# Patient Record
Sex: Female | Born: 1945
Health system: Southern US, Community
[De-identification: ages and names within clinical notes are randomized; demographics above are authoritative.]

## PROBLEM LIST (undated history)

## (undated) DIAGNOSIS — I10 Essential (primary) hypertension: Secondary | ICD-10-CM

## (undated) DIAGNOSIS — K754 Autoimmune hepatitis: Secondary | ICD-10-CM

## (undated) DIAGNOSIS — E7800 Pure hypercholesterolemia, unspecified: Secondary | ICD-10-CM

## (undated) HISTORY — DX: Autoimmune hepatitis: K75.4

## (undated) HISTORY — PX: LAPAROSCOPIC HYSTERECTOMY: SHX1926

---

## 2000-11-26 ENCOUNTER — Encounter: Payer: Self-pay | Admitting: General Surgery

## 2000-11-26 ENCOUNTER — Ambulatory Visit (HOSPITAL_COMMUNITY): Admission: RE | Admit: 2000-11-26 | Discharge: 2000-11-26 | Payer: Self-pay | Admitting: General Surgery

## 2000-12-10 ENCOUNTER — Other Ambulatory Visit: Admission: RE | Admit: 2000-12-10 | Discharge: 2000-12-10 | Payer: Self-pay | Admitting: Family Medicine

## 2001-04-03 ENCOUNTER — Emergency Department (HOSPITAL_COMMUNITY): Admission: EM | Admit: 2001-04-03 | Discharge: 2001-04-03 | Payer: Self-pay | Admitting: *Deleted

## 2002-06-01 ENCOUNTER — Ambulatory Visit (HOSPITAL_COMMUNITY): Admission: RE | Admit: 2002-06-01 | Discharge: 2002-06-01 | Payer: Self-pay | Admitting: General Surgery

## 2002-06-01 ENCOUNTER — Encounter: Payer: Self-pay | Admitting: General Surgery

## 2002-06-27 ENCOUNTER — Encounter: Payer: Self-pay | Admitting: General Surgery

## 2002-06-27 ENCOUNTER — Ambulatory Visit (HOSPITAL_COMMUNITY): Admission: RE | Admit: 2002-06-27 | Discharge: 2002-06-27 | Payer: Self-pay | Admitting: General Surgery

## 2002-06-28 ENCOUNTER — Encounter: Payer: Self-pay | Admitting: Gastroenterology

## 2002-06-28 ENCOUNTER — Ambulatory Visit (HOSPITAL_COMMUNITY): Admission: RE | Admit: 2002-06-28 | Discharge: 2002-06-28 | Payer: Self-pay | Admitting: General Surgery

## 2003-06-21 ENCOUNTER — Inpatient Hospital Stay (HOSPITAL_COMMUNITY): Admission: AD | Admit: 2003-06-21 | Discharge: 2003-06-25 | Payer: Self-pay | Admitting: Family Medicine

## 2003-09-21 ENCOUNTER — Ambulatory Visit (HOSPITAL_COMMUNITY): Admission: RE | Admit: 2003-09-21 | Discharge: 2003-09-21 | Payer: Self-pay | Admitting: Family Medicine

## 2004-02-22 ENCOUNTER — Ambulatory Visit (HOSPITAL_COMMUNITY): Admission: RE | Admit: 2004-02-22 | Discharge: 2004-02-22 | Payer: Self-pay | Admitting: Family Medicine

## 2005-01-21 ENCOUNTER — Ambulatory Visit (HOSPITAL_COMMUNITY): Admission: RE | Admit: 2005-01-21 | Discharge: 2005-01-21 | Payer: Self-pay | Admitting: Family Medicine

## 2005-08-28 ENCOUNTER — Ambulatory Visit (HOSPITAL_COMMUNITY): Admission: RE | Admit: 2005-08-28 | Discharge: 2005-08-28 | Payer: Self-pay | Admitting: Family Medicine

## 2006-02-22 ENCOUNTER — Ambulatory Visit (HOSPITAL_COMMUNITY): Admission: RE | Admit: 2006-02-22 | Discharge: 2006-02-22 | Payer: Self-pay | Admitting: Family Medicine

## 2007-01-29 ENCOUNTER — Observation Stay (HOSPITAL_COMMUNITY): Admission: EM | Admit: 2007-01-29 | Discharge: 2007-01-30 | Payer: Self-pay | Admitting: Emergency Medicine

## 2007-09-02 ENCOUNTER — Ambulatory Visit (HOSPITAL_COMMUNITY): Admission: RE | Admit: 2007-09-02 | Discharge: 2007-09-02 | Payer: Self-pay | Admitting: Family Medicine

## 2008-10-28 ENCOUNTER — Observation Stay (HOSPITAL_COMMUNITY): Admission: EM | Admit: 2008-10-28 | Discharge: 2008-10-29 | Payer: Self-pay | Admitting: Emergency Medicine

## 2009-01-06 ENCOUNTER — Emergency Department (HOSPITAL_COMMUNITY): Admission: EM | Admit: 2009-01-06 | Discharge: 2009-01-06 | Payer: Self-pay | Admitting: Family Medicine

## 2009-06-11 ENCOUNTER — Telehealth (INDEPENDENT_AMBULATORY_CARE_PROVIDER_SITE_OTHER): Payer: Self-pay

## 2009-07-18 ENCOUNTER — Ambulatory Visit: Payer: Self-pay | Admitting: Gastroenterology

## 2009-10-13 ENCOUNTER — Emergency Department (HOSPITAL_COMMUNITY)
Admission: EM | Admit: 2009-10-13 | Discharge: 2009-10-13 | Payer: Self-pay | Source: Home / Self Care | Admitting: Emergency Medicine

## 2009-10-23 ENCOUNTER — Encounter (HOSPITAL_COMMUNITY): Admission: RE | Admit: 2009-10-23 | Discharge: 2009-11-22 | Payer: Self-pay | Admitting: Family Medicine

## 2009-11-27 ENCOUNTER — Encounter (HOSPITAL_COMMUNITY)
Admission: RE | Admit: 2009-11-27 | Discharge: 2009-12-27 | Payer: Self-pay | Source: Home / Self Care | Admitting: Family Medicine

## 2009-12-30 ENCOUNTER — Encounter (HOSPITAL_COMMUNITY)
Admission: RE | Admit: 2009-12-30 | Discharge: 2010-01-29 | Payer: Self-pay | Source: Home / Self Care | Admitting: Family Medicine

## 2010-03-16 ENCOUNTER — Encounter: Payer: Self-pay | Admitting: Family Medicine

## 2010-03-25 NOTE — Letter (Signed)
Summary: colonoscopy report  colonoscopy report   Imported By: Curtis Sites 08/08/2009 11:06:46  _____________________________________________________________________  External Attachment:    Type:   Image     Comment:   External Document

## 2010-03-25 NOTE — Progress Notes (Signed)
Summary: pt needs ov before TCS  Phone Note Other Incoming   Caller: Radiance Deady Summary of Call: Pt was triaged for a TCS. However, Dr. Darrick Penna said she does need an OV prior to the procedure. She was scheduled for 07/23/2009 at 7:30. ( Im leaving there at this time, til i speak with pt. She may be able to use that appt. if she chooses for her scheduling.) Initial call taken by: Cloria Spring LPN,  June 11, 2009 11:57 AM     Appended Document: pt needs ov before TCS OV schedueld for 07/18/09 @ 9 w/ SLF- cdg

## 2010-03-25 NOTE — Assessment & Plan Note (Signed)
Summary: COLON CANCER SCREENING   Visit Type:  Initial Consult Referring Provider:  Mirna Mires Primary Care Provider:  Mirna Mires, M.D.  Chief Complaint:  Consult for TCS.  History of Present Illness: No abd pain. BMs: 2-3x/week. No problems with swallowing, nausea or vomiting. No change in bowel habits or weight loss. No blood in stool or melena. Appetite: good. No diarrhea.  Preventive Screening-Counseling & Management  Alcohol-Tobacco     Smoking Status: never  Current Medications (verified): 1)  Aspir-Low 81 Mg Tbec (Aspirin) .... Take 1 Tablet By Mouth Once A Day 2)  Biotin 1000 Mg .... One Tablet Daily 3)  Diovan Hct 160-25 Mg Tabs (Valsartan-Hydrochlorothiazide) .... Take 1 Tablet By Mouth Once A Day 4)  Daily Vitamins  Tabs (Multiple Vitamin) .... Take 1 Tablet By Mouth Once A Day  Allergies (verified): No Known Drug Allergies  Past History:  Past Medical History: Hypertension 2004 TCS: mild colitis, resolved   Past Surgical History: Dysmenorrhea/Hysterectomy 22  Family History: No FH of Colon Cancer or polyps  Social History: Marriedx 35yrs-2 kids-youngest 38 Patient has never smoked.  Alcohol Use - no Occupation: works for [employer] Smoking Status:  never  Review of Systems       Per HPI otherwise all systems negative.  Vital Signs:  Patient profile:   65 year old female Height:      62 inches Weight:      118 pounds BMI:     21.66 Temp:     98.4 degrees F oral Pulse rate:   80 / minute BP sitting:   110 / 70  (left arm) Cuff size:   regular  Vitals Entered By: Cloria Spring LPN (Jul 18, 2009 9:08 AM)  Physical Exam  General:  Well developed, well nourished, no acute distress. Head:  Normocephalic and atraumatic. Eyes:  PERRLA, no icterus. Mouth:  No deformity or lesions. Neck:  Supple; no masses. Lungs:  Clear throughout to auscultation. Heart:  Regular rate and rhythm; no murmurs. Abdomen:  Soft, nontender and nondistended. Normal  bowel sounds. Extremities:  No edema or deformities noted. Neurologic:  Alert and  oriented x4;  grossly normal neurologically.  Impression & Recommendations:  Problem # 1:  SCREENING, COLON CANCER (ICD-V76.51) Assessment Unchanged  Last TCS 2004. No warning signs and ot is average risk for developing colon CA. Next TCS 2014. Obtain labs from Dr. Loleta Chance.   CC: PCP  Orders: Consultation Level II [phone])  Appended Document: COLON CANCER SCREENING REMINDER IN COMPUTER  Appended Document: COLON CANCER SCREENING MAR 2011: CR 0.88 TRIG 98T BILI 0.4 AST 24 ALT 18 ALB 4.1

## 2010-05-30 LAB — DIFFERENTIAL
Basophils Absolute: 0 10*3/uL (ref 0.0–0.1)
Eosinophils Absolute: 0.1 10*3/uL (ref 0.0–0.7)
Eosinophils Relative: 2 % (ref 0–5)
Monocytes Absolute: 0.4 10*3/uL (ref 0.1–1.0)
Monocytes Relative: 10 % (ref 3–12)
Neutro Abs: 1.4 10*3/uL — ABNORMAL LOW (ref 1.7–7.7)
Neutrophils Relative %: 34 % — ABNORMAL LOW (ref 43–77)
Neutrophils Relative %: 35 % — ABNORMAL LOW (ref 43–77)

## 2010-05-30 LAB — CARDIAC PANEL(CRET KIN+CKTOT+MB+TROPI): CK, MB: 0.7 ng/mL (ref 0.3–4.0)

## 2010-05-30 LAB — POCT CARDIAC MARKERS
CKMB, poc: <1 ng/mL — ABNORMAL LOW (ref 1.0–8.0)
Myoglobin, poc: 84.3 ng/mL (ref 12–200)
Troponin i, poc: <0.05 ng/mL (ref 0.00–0.09)
Troponin i, poc: <0.05 ng/mL (ref 0.00–0.09)

## 2010-05-30 LAB — TSH: TSH: 1.802 u[IU]/mL (ref 0.350–4.500)

## 2010-05-30 LAB — COMPREHENSIVE METABOLIC PANEL
ALT: 22 U/L (ref 0–35)
AST: 24 U/L (ref 0–37)
BUN: 15 mg/dL (ref 6–23)
Calcium: 9.4 mg/dL (ref 8.4–10.5)
Creatinine, Ser: 0.79 mg/dL (ref 0.4–1.2)
GFR calc non Af Amer: >60 mL/min (ref >60)
Total Bilirubin: 0.5 mg/dL (ref 0.3–1.2)

## 2010-05-30 LAB — BASIC METABOLIC PANEL
Chloride: 108 mEq/L (ref 96–112)
Creatinine, Ser: 0.85 mg/dL (ref 0.4–1.2)
GFR calc non Af Amer: >60 mL/min (ref >60)
Potassium: 3.4 mEq/L — ABNORMAL LOW (ref 3.5–5.1)
Sodium: 140 mEq/L (ref 135–145)

## 2010-05-30 LAB — CBC
HCT: 30.7 % — ABNORMAL LOW (ref 36.0–46.0)
MCHC: 33.3 g/dL (ref 30.0–36.0)
MCHC: 33.6 g/dL (ref 30.0–36.0)
Platelets: 207 10*3/uL (ref 150–400)
RDW: 14.5 % (ref 11.5–15.5)
WBC: 4.1 10*3/uL (ref 4.0–10.5)
WBC: 4.6 10*3/uL (ref 4.0–10.5)

## 2010-05-30 LAB — FOLATE: Folate: >20 ng/mL

## 2010-05-30 LAB — D-DIMER, QUANTITATIVE: D-Dimer, Quant: 0.22 ug/mL-FEU (ref 0.00–0.48)

## 2010-05-30 LAB — RETICULOCYTES: Retic Ct Pct: 1.2 % (ref 0.4–3.1)

## 2010-05-30 LAB — IRON AND TIBC
Iron: 46 ug/dL (ref 42–135)
UIBC: 198 ug/dL

## 2010-05-30 LAB — FERRITIN: Ferritin: 133 ng/mL (ref 10–291)

## 2010-05-30 LAB — LIPID PANEL
HDL: 59 mg/dL (ref >39)
Triglycerides: 78 mg/dL (ref <150)
VLDL: 16 mg/dL (ref 0–40)

## 2010-07-08 NOTE — H&P (Signed)
[name], [name] NO.:  [id number]   MEDICAL RECORD NO.:  [id number]          PATIENT TYPE:  EMS   LOCATION:  ED                            FACILITY:  [facility]   PHYSICIAN:  Tilford Pillar, MD      DATE OF BIRTH:  [birth year]   DATE OF ADMISSION:  01/29/2007  DATE OF DISCHARGE:  LH                              HISTORY & PHYSICAL   CHIEF COMPLAINT:  Abdominal pain.   HISTORY OF PRESENT ILLNESS:  The patient is a 65 year old African-  American female with a history of hypertension and previous abdominal  hysterectomy who presents with approximately 24 hours of diffuse  abdominal pain.  This has increased slowly over the subsequent 24 hours,  and she describes it as a sharp stabbing diffuse pain in nature.  She  denies any exacerbating or alleviating features.  She has had no prior  symptoms of similar pain in the past.  She has noted no change in her  bowel movements with a normal bowel movement reported yesterday.  No  melena.  No hematochezia.  She denies any dysuria.  She does have  intermittent nausea secondary to the pain.  She has had no episodes of  bloody emesis.  She has had no sick contacts.   PAST MEDICAL HISTORY:  Hypertension.   PAST SURGICAL HISTORY:  Partial hysterectomy.   MEDICATIONS:  She is on estrogen and on a blood pressure pill that she  does not know the name of.   ALLERGIES:  No known drug allergies.   SOCIAL HISTORY:  No tobacco use.  No alcohol use.  No recreational drug  use.   REVIEW OF SYSTEMS:  CONSTITUTIONAL:  No recent weight loss or gain.  HEENT:  Eyes unremarkable.  Ears, nose, throat unremarkable.  No chest  pain.  No shortness of breath.  GASTROINTESTINAL:  As per HPI.  SKIN:  Unremarkable.  MUSCULOSKELETAL:  Unremarkable.   PHYSICAL EXAMINATION:  VITAL SIGNS:  Temperature 98.7, heart rate 88,  respirations 18, blood pressure 122/76, and 94% oxygen saturation on  room air.  GENERAL:  She is in no acute distress.  She is  lying supine on the  hospital cart.  HEENT:  Pupils are equal, round and reactive.  Extraocular movements are  intact.  No conjunctival pallor or scleral icterus is noted.  NECK:  No cervical lymphadenopathy is apparent.  PULMONARY:  Unlabored respirations.  She is clear to auscultation  bilaterally.  CARDIOVASCULAR:  Regular rate and rhythm.  There are 2+ radial pulses  bilaterally.  There are 2+ dorsalis pedis pulses bilaterally.  No  peripheral edema is noted.  ABDOMEN:  She has intermittent bowel sounds.  Her abdomen is soft, flat.  She is moderately to severely tender diffusely across her abdomen to  palpation.  She does not have any rebound tenderness.  She has no  peritoneal signs elicited.  No hernias or masses are apparent.  EXTREMITIES:  Warm and dry.   PERTINENT LABORATORY AND RADIOGRAPHIC STUDIES:  CBC:  White blood cell  count is 6.2, hemoglobin 10.9, hematocrit  33.8, platelets 245.  Basic  metabolic panel:  Sodium 133, potassium 3.5, chloride 99, bicarb 30, BUN  15, creatinine 0.88, blood glucose 99, calcium 8.9, albumin 3.5.  Liver  panel is within normal limits.  Urinalysis is negative.  No blood.  No  bacteria.  No protein.  Acute abdominal series demonstrates no acute  free air, unremarkable bowel/gas pattern.  CT evaluation of the abdomen  and pelvis demonstrated no free fluid, no free air.  No masses were  apparent.  No bowel wall thickening or dilatation was noted.  She does  have noted stool throughout her colon to the rectum.   ASSESSMENT/PLAN:  Abdominal pain, nonspecific.  At this time, the  patient does not have any acute abdominal findings suggestive of  emergent surgical indications.  Based on her examination, she is quite  tender, and due to this I feel it is warranted that she be observed for  at least a 23-hour period to continue to monitor this and recheck a  laboratory workup.  At this point, I will continue IV fluid hydration  and limited p.o.  status.  This is potentially a colitis or early  gastroenteritis.  As she does have stool throughout her colon, I do feel  a wash-out from below would be potentially beneficial and recommend  proceeding with soapsuds enemas at this time.  This was discussed with  the patient and the patient's family.  Should her symptomatology improve  I will plan at that time to discharge the patient to home.  Should her  pain worsen, should she develop fever, chills or increase in white blood  cell count, she will need to be re-evaluated for potential  intraabdominal source of her symptomatology.      Tilford Pillar, MD  Electronically Signed     BZ/MEDQ  D:  01/29/2007  T:  01/29/2007  Job:  [id number]   cc:   Patient's PCP

## 2010-07-11 NOTE — Discharge Summary (Signed)
NAME:  [name], CONWAY                           ACCOUNT NO.:  [id number]   MEDICAL RECORD NO.:  [id number]                   PATIENT TYPE:  INP   LOCATION:  A309                                 FACILITY:  [facility]   PHYSICIAN:  Annia Friendly. Loleta Chance, M.D.                DATE OF BIRTH:  01-13-1946   DATE OF ADMISSION:  06/21/2003  DATE OF DISCHARGE:                                 DISCHARGE SUMMARY   HISTORY OF PRESENT ILLNESS:  The patient was a 65 year old married, employed  black female from [place], [place].  Chief complaints was sore  throat.  History positive for sore throat x3 days.  The patient also  complained of general malaise, fever, chills, decreased appetite, bilateral  ear congestion and headache.  She also experienced increased saliva in mouth  the past 24 hours before admission.  The patient had frequent watery stool  (5) lasting 24 hours, also before admission.  She denied rash, runny nose,  post nasal drip, cough, wheezing and choking sensation.  The patient felt  like she had something constantly in throat for the past 2-3 days.  The  patient was seen in our office on June 20, 2003, for a sore throat.  She  had a temperature of 99.9.  She was treated with Claforan 100 mg IM and  Spectracef 200 mg p.o. b.i.d.  The patient was not allergic to any know  medication.  Habits were negative for tobacco, ethanol and street drugs.   PAST MEDICAL HISTORY:  1. Prior hospitalization of hysterectomy in 1988 secondary to fibroids.  2. Hospital for pregnancy.   FAMILY HISTORY:  Mother deceased in her 64's secondary to complication of  stroke.  Father deceased age 5 secondary to motor vehicle accident.  Three  sisters living, age 72, health unknown; age 57, good health; age 15, history  of rheumatoid arthritis; one sister deceased at age 53 secondary to  complications of breast cancer.  Four brothers living, age 81, health  unknown; age 70, good health; age 7, good health; age  55, with history of  hypertension.  Two sons living, age 21, good health; age 80, history of  multiple sclerosis.   PROBLEMS:  1. ACUTE PHARYNGITIS.  Vitals on admission were as follows:  Temperature 99,     pulse 99, respirations 16, blood pressure 116/69.  General appearance     revealed middle aged, medium height, medium frame, alert, black female who     appeared not to feel well, but in no apparent respiratory distress.  Nose     as negative at discharge.  Posterior pharynx was negative for enlarged     tonsils but positive for bilateral white exudate in retropharyngeal space     bilaterally.  Neck was positive for anterior nodes on palpation     bilaterally.  Supraclavicular space demonstrated no palpable nodes.     Lungs were  clear.  Heart revealed audible S1, S2 without murmur.  Rhythm     was regular and rate within normal limits.  Abdomen was negative for     distention.  Abdominal exam demonstrated hypoactive bowel sounds.     Abdomen as positive for old healed mid hypogastric surgical scar.     Abdominal exam demonstrated no tenderness in all four quadrants or     palpable masses.  Extremities demonstrated no joint swelling, redness or     hotness.  The patient was neurologically intact.   Significant labs on admission were as follows:  White count 26,000,  hemoglobin 12.1, hematocrit 37, platelets 209,000.  Sodium 132, potassium  2.8, chloride 98, CO2 24, glucose 130, BUN 29, creatinine 1.5.  Total  bilirubin 0.8, SGOT 23, SGPT 17, total protein 7.6, albumin 3.1, calcium  8.5.  Group A screen was negative.  Monospot screening was also negative.  A  throat culture was done.  The patient was put on IV penicillin, IV Levaquin,  Zantac suspension 150 mg q.12h., Toradol 30 mg IV q.6h.  Chest x-ray:  X-ray  of neck and EKG.  Blood cultures were also done.  Chest x-ray on admission  was read as no evidence of acute cardiopulmonary process.  The  cardiopericardial silhouette was  within normal limits besides.  Bony  structure of the __________ were intact.  Interstitial markings were  coarsened as read by Dr. Kennith Center.  EKG demonstrated normal sinus rhythm  with a rate of 95, right atrial enlargement and a computer read as high QRS  voltage.  The patient responded to therapy.  Blood cultures were negative  for growth.  Throat culture was pending at the time of discharge.  Repeat  white count on June 22, 2003, was 17,000.  Repeat white count on June 23, 2003, was 8100.  The patient was not complaining of fever, chills or general  malaise.  Diet was advanced to a regular during this hospitalization.  She  was afebrile at the time of discharge.  She had no complaint of sore throat,  general malaise, fever or chills.  Examination of posterior pharynx  demonstrated no exudate or erythema.  Examination of neck at the time of  discharge demonstrated no tender nose.   #2 - HYPOKALEMIA.  Serum potassium on admission was 2.8.  Repeat serum  potassium on June 23, 2003, secondary to receiving potassium supplement was  5.2.  The patient was not complaining of leg cramps or weak at the time of  discharge.   #3 - ANEMIA WITH IRON DEFICIENCY.  An anemia panel was ordered during this  hospitalization because of drop of hemoglobin to 10.5, hematocrit 31.2 after  hydration with IV fluids.  Anemia panel demonstrated the following on June 22, 2003.  Serum iron 32 g/dL (normal 38-756), total iron binding capacity  263 g/dL (normal 433-295), iron percent saturation 12 (normal 20-55),  ferritin 1410 ng per meal, B12 1425 g/ml.  The patient was put on iron  supplement during this hospitalization.   #4 - ACUTE LEFT FOREARM PHLEBITIS.  Phlebitis occurred due to complication  of IV site.  Patient complaining of left arm swelling and pain.  Examination  of the left arm demonstrated some redness and tenderness along vein linearly.  The patient was treated with warm, moist  compresses.  At the time  of discharge, she experienced decreased swelling, decreased pain of left  forearm.  She has been advised to continue warm  moist compresses to left  forearm as outpatient.  She is afebrile.   DISCHARGE INSTRUCTIONS:   DIET:  Regular.   ACTIVITY:  No restrictions.   MEDICATIONS:  1. Penicillin Vee-K 500 mg p.o. q.i.d. x7 days.  2. Hemocyte Plus 1 tablet p.o. daily.  3. Allegra Day 1 tablet p.o. q.12h. for postnasal drainage.  4. Pepcid 20 mg p.o. q.bedtime.   FINAL PRIMARY DIAGNOSIS:  1. Acute pharyngitis.  2. Hypokalemia.   SECONDARY DIAGNOSES:  1. Seasonal allergies with postnasal drip.  2. Acute left forearm phlebitis.  3. Chronic anemia of iron deficiency.    ___________________________________________                                         Annia Friendly. Loleta Chance, M.D.   Levonne Hubert  D:  06/25/2003  T:  06/25/2003  Job:  [id number]

## 2010-07-11 NOTE — H&P (Signed)
NAME:  [name], ERCK                           ACCOUNT NO.:  [id number]   MEDICAL RECORD NO.:  [id number]                   PATIENT TYPE:  INP   LOCATION:  A309                                 FACILITY:  [facility]   PHYSICIAN:  Annia Friendly. Loleta Chance, M.D.                DATE OF BIRTH:  24-Jul-1945   DATE OF ADMISSION:  06/21/2003  DATE OF DISCHARGE:                                HISTORY & PHYSICAL   HISTORY OF PRESENT ILLNESS:  The patient is a 65 year old, married, employed  black female from [place], [place], with the chief complaint of  sore throat.  History is positive for sore throat for three days.  The  patient also complains of general malaise, fever, chills, decreased  appetite, ear congestion, and headache.  The patient has been experiencing  increased saliva in mouth for the past 24 hours.  She also complains of  nausea of less than 24 hours and frequent watery stools (5) less than 24  hours.  She denies a rash, runny nose, postnasal drip, cough, wheezing, and  choking sensation.  The patient feels like she always has something in her  throat.  Initially her throat irritation started out as a tickling  sensation.  Medical history is negative for hypertension, diabetes,  tuberculosis, cancer, sickle cell, asthma, seizure disorder.  The patient  was seen in the office on June 20, 2003 for a sore throat.  She had a  temperature of 99.9.  She was treated with Claforan 500 mg IM and Spectra  200 mg p.o. b.i.d.   ALLERGIES:  The patient is not allergic to any medications.   HABITS:  Negative for tobacco, ethanol or street drugs.   PAST MEDICAL HISTORY:  1. Hospitalization for hysterectomy in 1988 secondary to fibroid.  2. Hospitalization for pregnancy.   FAMILY HISTORY:  Mother deceased in her 33's secondary to complications of  stroke.  Father deceased, age 41, secondary to motor vehicle accident.  Three sisters living.  Age 54, health unknown; age 51, in good health; age  66, with history of rheumatoid arthritis.  One sister is deceased at age 37  secondary to complications of breast cancer.  Four brothers living - age 26,  health unknown; age 28, in good health; age 70, in good health; age 73, with  history of hypertension.  Two sons living at age 51 in good health and age  35 with history of multiple sclerosis.   REVIEW OF SYSTEMS:  Negative for chronic cough, hemoptysis, hematemesis,  dysuria, gross hematuria, constipation, hematochezia, edema of legs,  unexplained fever, lumps in breasts, discharge from breasts, etc.   PHYSICAL EXAMINATION:  VITAL SIGNS:  Temperature 99, pulse 99, respirations  16, blood pressure 116/69.  GENERAL:  General appearance reveals middle-aged, medium height, medium  frame, alert, black female who appeared not to feel well, but in no apparent  respiratory distress.  HEENT:  Head normocephalic.  Ears - normal auricles.  Skin hot and dry.  Eyes negative for ptosis, sclerae white.  Pupils round, equal and reactive  to light.  Extraocular movement intact.  Nose negative for discharge.  Mouth  with fair dentition, no bleeding gums, no oral lesions.  Posterior pharynx  negative for enlarged tonsils but positive for bilateral white exudate in  the left and right retropharyngeal space.  NECK:  Positive for tender anterior nodes on palpation bilaterally.  Supraclavicular space shows no palpable nodes.  LUNGS:  Clear.  HEART:  Audible S1 and S2 without murmur.  Regular rate and rhythm.  BREASTS:  No skin changes.  Nipples regular.  ABDOMEN:  No distention.  Hyperactive bowel sounds.  Positive old healed mid  hypogastric surgical scar.  Soft, nontender in all four quadrants.  No  palpable masses or organomegaly.  PELVIS:  Deferred.  RECTAL:  Deferred.  EXTREMITIES:  No edema, no joint swelling, no joint redness, no joint  hotness.  NEUROLOGIC: Alert and oriented to person, place and time.  Cranial nerves II-  XII appeared  intact.   LABORATORY DATA:  White count 26,000, hemoglobin 12.1, hematocrit 37,  platelets 209,000, sodium 132, potassium 2.8, chloride 98, CO2 24, glucose  130, BUN 29, creatinine 1.5.  Total bilirubin 0.8, SGOT 23, SGPT 17, total  protein 7.6, albumin 3.1, calcium 8.5.  Streptococcus group A screen  negative.  Monospot screening was negative.   IMPRESSION:  Acute pharyngitis with leukocytosis.   SECONDARY DIAGNOSES:  1. Hypokalemia.  2. Mild prerenal azotemia.   PLAN:  1. IV fluids.  2. IV penicillin.  3. Gentamicin 100 mg IV x1.  4. Levaquin 500 mg IV daily.  5. Throat cultures.  6. Blood cultures.  7. Tylenol for temperature of 101 or greater q.4-6h. p.r.n.  8. Toradol 30 mg IV q.6h. for 72 hours.  9. X-ray of her throat and chest x-ray.  10.      Diet of clear liquids.  11.      Repeat CBC and SMA-7 in the mornings x3.  12.      Watch I&O's.     ___________________________________________                                         Annia Friendly. Loleta Chance, M.D.   Levonne Hubert  D:  06/21/2003  T:  06/21/2003  Job:  [id number]

## 2010-07-11 NOTE — Discharge Summary (Signed)
NAMEBRITTAN, [name], MD      DATE OF BIRTH:  05/29/1945   DATE OF ADMISSION:  01/29/2007  DATE OF DISCHARGE:  12/07/2008LH                               DISCHARGE SUMMARY   ADMISSION DIAGNOSIS:  Abdominal pain, unspecified.   DISCHARGE DIAGNOSES:  1. Obstipation, constipation.  2. Hypertension.   PROCEDURES:  None.   ADMITTING SURGEON:  Tilford Pillar, M.D.   DISPOSITION:  Home.   BRIEF HISTORY AND PHYSICAL:  Please see the admission history and  physical for the complete H&P.  Patient is a 65 year old African-  American female who presented to [facility] emergency department with  acute abdominal pain.  This was diffuse in nature, described as colicky  in characteristics.  She denied any exacerbating or relieving features.  Evaluation in the emergency department, she did have significant  abdominal pain diffusely, although she denies having any peritoneal  signs or acute indications for surgery to warrant intervention.  Laboratory evaluation and CT evaluation, there was no evidence of any  peritoneal catastrophe noted, free air or free fluid.  She had a normal  white blood cell count.  Due to the nature of her abdominal pain, she  was admitted for continued workup and management to [facility].   HOSPITAL COURSE:  Patient was admitted on January 29, 2007.  Upon  reaching the floor, she was continued on an n.p.o. status.  Additionally, she was given a soapsuds enema for a washout from below.  This did cause stimulation of bowel function and soon had resolution of  her symptoms.  At this point with no nausea and vomiting, she was again  advanced back to a diet.  She tolerated this well and plans were made  for discharge on January 30, 2007.   DISCHARGE INSTRUCTIONS:  Patient was  instructed to continue stool  softener and milk of magnesia for bowel stimulation, as required.  She  was also instructed to continue with increasing amount of fluid intake  over the next several days to encourage normal bowel function.  She is  to follow up with her primary care physician later this week, and she  can follow up in my office as needed.  This was discussed to return on a  p.r.n. basis.      Tilford Pillar, MD  Electronically Signed     BZ/MEDQ  D:  01/31/2007  T:  02/01/2007  Job:  [id number]

## 2010-08-21 ENCOUNTER — Other Ambulatory Visit (HOSPITAL_COMMUNITY): Payer: Self-pay | Admitting: Family Medicine

## 2010-08-21 DIAGNOSIS — Z139 Encounter for screening, unspecified: Secondary | ICD-10-CM

## 2010-08-25 ENCOUNTER — Ambulatory Visit (HOSPITAL_COMMUNITY)
Admission: RE | Admit: 2010-08-25 | Discharge: 2010-08-25 | Disposition: A | Discharge status: Home / Self Care | Payer: 59 | Source: Ambulatory Visit | Attending: Family Medicine | Admitting: Family Medicine

## 2010-08-25 DIAGNOSIS — Z139 Encounter for screening, unspecified: Secondary | ICD-10-CM

## 2010-08-25 DIAGNOSIS — Z1231 Encounter for screening mammogram for malignant neoplasm of breast: Secondary | ICD-10-CM | POA: Insufficient documentation

## 2010-08-28 ENCOUNTER — Ambulatory Visit (HOSPITAL_COMMUNITY): Payer: 59

## 2010-12-01 LAB — BASIC METABOLIC PANEL
BUN: 15
CO2: 30
Calcium: 8.9
Chloride: 102
Creatinine, Ser: 0.88
Creatinine, Ser: 0.96
GFR calc Af Amer: >60
GFR calc Af Amer: >60
GFR calc non Af Amer: >60
Glucose, Bld: 114 — ABNORMAL HIGH
Sodium: 135

## 2010-12-01 LAB — DIFFERENTIAL
Basophils Relative: 1
Eosinophils Absolute: 0 — ABNORMAL LOW
Eosinophils Absolute: 0 — ABNORMAL LOW
Lymphocytes Relative: 22
Lymphs Abs: 1.4
Monocytes Absolute: 0.4
Monocytes Relative: 8
Neutro Abs: 4.4
Neutrophils Relative %: 70

## 2010-12-01 LAB — HEPATIC FUNCTION PANEL
AST: 18
Albumin: 3.5
Alkaline Phosphatase: 97
Total Protein: 7.7

## 2010-12-01 LAB — URINALYSIS, ROUTINE W REFLEX MICROSCOPIC
Ketones, ur: NEGATIVE
Nitrite: NEGATIVE
Protein, ur: NEGATIVE
Urobilinogen, UA: 0.2

## 2010-12-01 LAB — CBC
Hemoglobin: 9.6 — ABNORMAL LOW
MCHC: 32.7
MCV: 77.5 — ABNORMAL LOW
MCV: 78
Platelets: 248
RBC: 3.78 — ABNORMAL LOW
RBC: 4.32
WBC: 6.2

## 2011-07-17 ENCOUNTER — Other Ambulatory Visit (HOSPITAL_COMMUNITY): Payer: Self-pay | Admitting: Family Medicine

## 2011-07-17 DIAGNOSIS — Z139 Encounter for screening, unspecified: Secondary | ICD-10-CM

## 2011-08-28 ENCOUNTER — Ambulatory Visit (HOSPITAL_COMMUNITY)
Admission: RE | Admit: 2011-08-28 | Discharge: 2011-08-28 | Disposition: A | Discharge status: Home / Self Care | Payer: 59 | Source: Ambulatory Visit | Attending: Family Medicine | Admitting: Family Medicine

## 2011-08-28 DIAGNOSIS — Z1231 Encounter for screening mammogram for malignant neoplasm of breast: Secondary | ICD-10-CM | POA: Insufficient documentation

## 2011-08-28 DIAGNOSIS — Z139 Encounter for screening, unspecified: Secondary | ICD-10-CM

## 2012-07-26 ENCOUNTER — Other Ambulatory Visit (HOSPITAL_COMMUNITY): Payer: Self-pay | Admitting: Family Medicine

## 2012-07-26 DIAGNOSIS — Z139 Encounter for screening, unspecified: Secondary | ICD-10-CM

## 2012-08-09 ENCOUNTER — Encounter: Payer: Self-pay | Admitting: Gastroenterology

## 2012-08-29 ENCOUNTER — Ambulatory Visit (HOSPITAL_COMMUNITY)
Admission: RE | Admit: 2012-08-29 | Discharge: 2012-08-29 | Disposition: A | Discharge status: Home / Self Care | Payer: Medicare Other | Source: Ambulatory Visit | Attending: Family Medicine | Admitting: Family Medicine

## 2012-08-29 DIAGNOSIS — Z139 Encounter for screening, unspecified: Secondary | ICD-10-CM

## 2012-08-29 DIAGNOSIS — Z1231 Encounter for screening mammogram for malignant neoplasm of breast: Secondary | ICD-10-CM | POA: Insufficient documentation

## 2012-09-16 ENCOUNTER — Encounter (HOSPITAL_COMMUNITY): Payer: Self-pay | Admitting: Emergency Medicine

## 2012-09-16 ENCOUNTER — Emergency Department (INDEPENDENT_AMBULATORY_CARE_PROVIDER_SITE_OTHER)
Admission: EM | Admit: 2012-09-16 | Discharge: 2012-09-16 | Disposition: A | Discharge status: Home / Self Care | Payer: Medicare Other | Source: Home / Self Care

## 2012-09-16 DIAGNOSIS — W57XXXA Bitten or stung by nonvenomous insect and other nonvenomous arthropods, initial encounter: Secondary | ICD-10-CM

## 2012-09-16 DIAGNOSIS — L259 Unspecified contact dermatitis, unspecified cause: Secondary | ICD-10-CM

## 2012-09-16 DIAGNOSIS — L239 Allergic contact dermatitis, unspecified cause: Secondary | ICD-10-CM

## 2012-09-16 DIAGNOSIS — T148 Other injury of unspecified body region: Secondary | ICD-10-CM

## 2012-09-16 MED ORDER — TRIAMCINOLONE ACETONIDE 0.1 % EX CREA: TOPICAL_CREAM | Freq: Two times a day (BID) | CUTANEOUS | Status: DC

## 2012-09-16 NOTE — ED Provider Notes (Signed)
CSN: [id number]     Arrival date & time 09/16/12  1030 History     None    Chief Complaint  Patient presents with  . Facial Swelling   (Consider location/radiation/quality/duration/timing/severity/associated sxs/prior Treatment) HPI Comments: 67 year old healthy-appearing female presents with a complaint of swelling and itching on various parts of her body for approximately 2 days. The initial area involved the area under the right eye. There is puffiness itching and mild erythema now. 2 days ago there was swelling that was substantially greater than today. She states it has improved equally after applying cold packs. She has no trouble with vision. There are 2 other areas marked by small papules on the right upper arm and forearm. She denies systemic symptoms. She was told by her dentist today that her gums little swollen.   History reviewed. No pertinent past medical history. History reviewed. No pertinent past surgical history. History reviewed. No pertinent family history. History  Substance Use Topics  . Smoking status: Never Smoker   . Smokeless tobacco: Not on file  . Alcohol Use: No   OB History   Grav Para Term Preterm Abortions TAB SAB Ect Mult Living                 Review of Systems  Constitutional: Negative.   HENT: Positive for facial swelling. Negative for hearing loss, congestion, sore throat, rhinorrhea, sneezing, neck pain and postnasal drip.   Eyes: Positive for itching. Negative for pain, discharge, redness and visual disturbance.  Respiratory: Negative.  Negative for cough, choking, shortness of breath and wheezing.   Cardiovascular: Negative.   Gastrointestinal: Negative.   Genitourinary: Negative.   Skin:       As per history of present illness No systemic rash.  Neurological: Negative.   Psychiatric/Behavioral: Negative.     Allergies  Review of patient's allergies indicates no known allergies.  Home Medications   Current Outpatient Rx  Name   Route  Sig  Dispense  Refill  . Valsartan (DIOVAN PO)   Oral   Take by mouth daily.         Marland Kitchen triamcinolone cream (KENALOG) 0.1 %   Topical   Apply topically 2 (two) times daily. Apply for 2 weeks. May use on face   30 g   0    BP 163/77  Pulse 68  Temp(Src) 98.3 F (36.8 C) (Oral)  Resp 16  SpO2 100% Physical Exam  Nursing note and vitals reviewed. Constitutional: She is oriented to person, place, and time. She appears well-developed and well-nourished. No distress.  HENT:  Bilateral TMs are normal. Oropharynx is moist and clear without erythema, drainage or swelling. She is primarily edentulous. I do not appreciate intraoral swelling. I do not appreciate swelling or edema of the gums/gingiva. The soft palate is of normal size without erythema or swelling. Airway is widely patent.  Cardiovascular: Normal rate and regular rhythm.   Pulmonary/Chest: Effort normal. No respiratory distress. She has no wheezes. She has no rales.  Abdominal: There is no tenderness.  Musculoskeletal: She exhibits no edema and no tenderness.  Neurological: She is alert and oriented to person, place, and time.  Skin: Skin is warm and dry.  There is puffiness beneath the right eye does not about the eye proper. No surrounding papules. There are 2 areas to the right arm. One of the forearm one on the upper arm. The areas have localized swelling with minor erythema and several small papules. No vesicles or drainage. No  lymphangitis. No signs of infection. No  lesions to other body surface areas.  Psychiatric: She has a normal mood and affect.    ED Course   Procedures (including critical care time)  Labs Reviewed - No data to display No results found. 1. Multiple insect bites   2. Contact allergic reaction     MDM  I suspect that these small papules surrounded by mild erythema and swelling which are pruritic are likely caused by a contact dermatitis or insect bite  Or sting. There is no pain  associated with them. Apply triamcinolone cream as directed twice a day to affected areas Take Benadryl 25 mg every 4 hours when necessary itching. For new symptoms problems or worsening may return.  Hayden Rasmussen, NP 09/16/12 1128  Hayden Rasmussen, NP 09/16/12 1415

## 2012-09-16 NOTE — ED Notes (Signed)
Patient states that she is having some swelling on her face, gums and right arm.  Patient states she doesn't think its an allergic reaction since she has not did anything differently.  The swelling areas does itch.  Patient has tried benadryl.

## 2012-09-17 NOTE — ED Provider Notes (Signed)
Medical screening examination/treatment/procedure(s) were performed by a resident physician or non-physician practitioner and as the supervising physician I was immediately available for consultation/collaboration.  Clementeen Graham, MD   Rodolph Bong, MD 09/17/12 1344

## 2013-07-11 DIAGNOSIS — E785 Hyperlipidemia, unspecified: Secondary | ICD-10-CM | POA: Diagnosis not present

## 2013-07-11 DIAGNOSIS — K118 Other diseases of salivary glands: Secondary | ICD-10-CM | POA: Diagnosis not present

## 2013-07-11 DIAGNOSIS — I10 Essential (primary) hypertension: Secondary | ICD-10-CM | POA: Diagnosis not present

## 2013-10-18 ENCOUNTER — Other Ambulatory Visit (HOSPITAL_COMMUNITY): Payer: Self-pay | Admitting: Family Medicine

## 2013-10-18 DIAGNOSIS — Z139 Encounter for screening, unspecified: Secondary | ICD-10-CM

## 2013-10-23 ENCOUNTER — Other Ambulatory Visit (HOSPITAL_COMMUNITY): Payer: Self-pay | Admitting: Family Medicine

## 2013-10-23 ENCOUNTER — Ambulatory Visit (HOSPITAL_COMMUNITY)
Admission: RE | Admit: 2013-10-23 | Discharge: 2013-10-23 | Disposition: A | Discharge status: Home / Self Care | Payer: Medicare Other | Source: Ambulatory Visit | Attending: Family Medicine | Admitting: Family Medicine

## 2013-10-23 DIAGNOSIS — Z1231 Encounter for screening mammogram for malignant neoplasm of breast: Secondary | ICD-10-CM | POA: Insufficient documentation

## 2013-12-07 DIAGNOSIS — I10 Essential (primary) hypertension: Secondary | ICD-10-CM | POA: Diagnosis not present

## 2013-12-07 DIAGNOSIS — E785 Hyperlipidemia, unspecified: Secondary | ICD-10-CM | POA: Diagnosis not present

## 2013-12-10 ENCOUNTER — Emergency Department (HOSPITAL_COMMUNITY)
Admission: EM | Admit: 2013-12-10 | Discharge: 2013-12-10 | Disposition: A | Discharge status: Home / Self Care | Payer: Medicare Other | Attending: Emergency Medicine | Admitting: Emergency Medicine

## 2013-12-10 ENCOUNTER — Encounter (HOSPITAL_COMMUNITY): Payer: Self-pay | Admitting: Emergency Medicine

## 2013-12-10 DIAGNOSIS — R531 Weakness: Secondary | ICD-10-CM | POA: Diagnosis present

## 2013-12-10 DIAGNOSIS — R110 Nausea: Secondary | ICD-10-CM | POA: Diagnosis not present

## 2013-12-10 DIAGNOSIS — R197 Diarrhea, unspecified: Secondary | ICD-10-CM | POA: Diagnosis not present

## 2013-12-10 DIAGNOSIS — I10 Essential (primary) hypertension: Secondary | ICD-10-CM | POA: Insufficient documentation

## 2013-12-10 DIAGNOSIS — R079 Chest pain, unspecified: Secondary | ICD-10-CM | POA: Diagnosis not present

## 2013-12-10 DIAGNOSIS — Z792 Long term (current) use of antibiotics: Secondary | ICD-10-CM | POA: Insufficient documentation

## 2013-12-10 DIAGNOSIS — E860 Dehydration: Secondary | ICD-10-CM | POA: Diagnosis not present

## 2013-12-10 DIAGNOSIS — Z8639 Personal history of other endocrine, nutritional and metabolic disease: Secondary | ICD-10-CM | POA: Insufficient documentation

## 2013-12-10 DIAGNOSIS — Z79899 Other long term (current) drug therapy: Secondary | ICD-10-CM | POA: Insufficient documentation

## 2013-12-10 DIAGNOSIS — Z7982 Long term (current) use of aspirin: Secondary | ICD-10-CM | POA: Insufficient documentation

## 2013-12-10 DIAGNOSIS — R509 Fever, unspecified: Secondary | ICD-10-CM | POA: Diagnosis not present

## 2013-12-10 DIAGNOSIS — R42 Dizziness and giddiness: Secondary | ICD-10-CM | POA: Diagnosis not present

## 2013-12-10 HISTORY — DX: Pure hypercholesterolemia, unspecified: E78.00

## 2013-12-10 HISTORY — DX: Essential (primary) hypertension: I10

## 2013-12-10 LAB — CBC WITH DIFFERENTIAL/PLATELET
BASOS ABS: 0 10*3/uL (ref 0.0–0.1)
Basophils Relative: 0 % (ref 0–1)
EOS PCT: 2 % (ref 0–5)
Eosinophils Absolute: 0.2 10*3/uL (ref 0.0–0.7)
HEMATOCRIT: 32.8 % — AB (ref 36.0–46.0)
HEMOGLOBIN: 11 g/dL — AB (ref 12.0–15.0)
LYMPHS ABS: 0.6 10*3/uL — AB (ref 0.7–4.0)
LYMPHS PCT: 5 % — AB (ref 12–46)
MCH: 25.8 pg — ABNORMAL LOW (ref 26.0–34.0)
MCHC: 33.5 g/dL (ref 30.0–36.0)
MCV: 76.8 fL — AB (ref 78.0–100.0)
MONO ABS: 0.5 10*3/uL (ref 0.1–1.0)
MONOS PCT: 4 % (ref 3–12)
NEUTROS ABS: 11 10*3/uL — AB (ref 1.7–7.7)
Neutrophils Relative %: 89 % — ABNORMAL HIGH (ref 43–77)
Platelets: 257 10*3/uL (ref 150–400)
RBC: 4.27 MIL/uL (ref 3.87–5.11)
RDW: 14.7 % (ref 11.5–15.5)
WBC: 12.3 10*3/uL — AB (ref 4.0–10.5)

## 2013-12-10 LAB — BASIC METABOLIC PANEL
ANION GAP: 16 — AB (ref 5–15)
BUN: 33 mg/dL — ABNORMAL HIGH (ref 6–23)
CALCIUM: 9.7 mg/dL (ref 8.4–10.5)
CHLORIDE: 95 meq/L — AB (ref 96–112)
CO2: 25 meq/L (ref 19–32)
CREATININE: 1.4 mg/dL — AB (ref 0.50–1.10)
GFR calc Af Amer: 44 mL/min — ABNORMAL LOW (ref >90)
GFR calc non Af Amer: 38 mL/min — ABNORMAL LOW (ref >90)
GLUCOSE: 128 mg/dL — AB (ref 70–99)
Potassium: 3.1 mEq/L — ABNORMAL LOW (ref 3.7–5.3)
Sodium: 136 mEq/L — ABNORMAL LOW (ref 137–147)

## 2013-12-10 LAB — CK: Total CK: 56 U/L (ref 7–177)

## 2013-12-10 MED ORDER — SODIUM CHLORIDE 0.9 % IV BOLUS (SEPSIS)
1000.0000 mL | Freq: Once | INTRAVENOUS | Status: AC
  Administered 2013-12-10: 1000 mL via INTRAVENOUS

## 2013-12-10 MED ORDER — SODIUM CHLORIDE 0.9 % IV SOLN: Freq: Once | INTRAVENOUS | Status: DC

## 2013-12-10 MED ORDER — DIPHENOXYLATE-ATROPINE 2.5-0.025 MG PO TABS
2.0000 | ORAL_TABLET | Freq: Once | ORAL | Status: AC
  Administered 2013-12-10: 2 via ORAL
  Filled 2013-12-10: qty 2

## 2013-12-10 MED ORDER — ONDANSETRON HCL 4 MG/2ML IJ SOLN
4.0000 mg | Freq: Once | INTRAMUSCULAR | Status: AC
  Administered 2013-12-10: 4 mg via INTRAVENOUS
  Filled 2013-12-10: qty 2

## 2013-12-10 MED ORDER — DIPHENOXYLATE-ATROPINE 2.5-0.025 MG PO TABS: 1.0000 | ORAL_TABLET | Freq: Four times a day (QID) | ORAL | Status: DC | PRN

## 2013-12-10 MED ORDER — ONDANSETRON 4 MG PO TBDP: 4.0000 mg | ORAL_TABLET | Freq: Three times a day (TID) | ORAL | Status: DC | PRN

## 2013-12-10 NOTE — ED Notes (Signed)
Patient c/o weakness since Friday.  Patient states she saw her PMD on Thursday and was started on a new cholesterol medicine.  Patient states since then, she has had some abdominal pain, diarrhea and weakness.

## 2013-12-10 NOTE — Discharge Instructions (Signed)
Your kidney function is slightly elevated. Is as likely secondary to dehydration from your diarrhea. Check with your physician this week to recheck her creatinine level. If your creatinine remains high you may need to be on different blood pressure medications. Stay well hydrated.  Dehydration, Adult Dehydration is when you lose more fluids from the body than you take in. Vital organs like the kidneys, brain, and heart cannot function without a proper amount of fluids and salt. Any loss of fluids from the body can cause dehydration.  CAUSES   Vomiting.  Diarrhea.  Excessive sweating.  Excessive urine output.  Fever. SYMPTOMS  Mild dehydration  Thirst.  Dry lips.  Slightly dry mouth. Moderate dehydration  Very dry mouth.  Sunken eyes.  Skin does not bounce back quickly when lightly pinched and released.  Dark urine and decreased urine production.  Decreased tear production.  Headache. Severe dehydration  Very dry mouth.  Extreme thirst.  Rapid, weak pulse (more than 100 beats per minute at rest).  Cold hands and feet.  Not able to sweat in spite of heat and temperature.  Rapid breathing.  Blue lips.  Confusion and lethargy.  Difficulty being awakened.  Minimal urine production.  No tears. DIAGNOSIS  Your caregiver will diagnose dehydration based on your symptoms and your exam. Blood and urine tests will help confirm the diagnosis. The diagnostic evaluation should also identify the cause of dehydration. TREATMENT  Treatment of mild or moderate dehydration can often be done at home by increasing the amount of fluids that you drink. It is best to drink small amounts of fluid more often. Drinking too much at one time can make vomiting worse. Refer to the home care instructions below. Severe dehydration needs to be treated at the hospital where you will probably be given intravenous (IV) fluids that contain water and electrolytes. HOME CARE INSTRUCTIONS     Ask your caregiver about specific rehydration instructions.  Drink enough fluids to keep your urine clear or pale yellow.  Drink small amounts frequently if you have nausea and vomiting.  Eat as you normally do.  Avoid:  Foods or drinks high in sugar.  Carbonated drinks.  Juice.  Extremely hot or cold fluids.  Drinks with caffeine.  Fatty, greasy foods.  Alcohol.  Tobacco.  Overeating.  Gelatin desserts.  Wash your hands well to avoid spreading bacteria and viruses.  Only take over-the-counter or prescription medicines for pain, discomfort, or fever as directed by your caregiver.  Ask your caregiver if you should continue all prescribed and over-the-counter medicines.  Keep all follow-up appointments with your caregiver. SEEK MEDICAL CARE IF:  You have abdominal pain and it increases or stays in one area (localizes).  You have a rash, stiff neck, or severe headache.  You are irritable, sleepy, or difficult to awaken.  You are weak, dizzy, or extremely thirsty. SEEK IMMEDIATE MEDICAL CARE IF:   You are unable to keep fluids down or you get worse despite treatment.  You have frequent episodes of vomiting or diarrhea.  You have blood or green matter (bile) in your vomit.  You have blood in your stool or your stool looks black and tarry.  You have not urinated in 6 to 8 hours, or you have only urinated a small amount of very dark urine.  You have a fever.  You faint. MAKE SURE YOU:   Understand these instructions.  Will watch your condition.  Will get help right away if you are not doing well  or get worse. Document Released: 02/09/2005 Document Revised: 05/04/2011 Document Reviewed: 09/29/2010 [facility] Patient Information 2015 [place], [place]. This information is not intended to replace advice given to you by your health care provider. Make sure you discuss any questions you have with your health care provider.  Diarrhea Diarrhea is watery poop  (stool). It can make you feel weak, tired, thirsty, or give you a dry mouth (signs of dehydration). Watery poop is a sign of another problem, most often an infection. It often lasts 2-3 days. It can last longer if it is a sign of something serious. Take care of yourself as told by your doctor. HOME CARE   Drink 1 cup (8 ounces) of fluid each time you have watery poop.  Do not drink the following fluids:  Those that contain simple sugars (fructose, glucose, galactose, lactose, sucrose, maltose).  Sports drinks.  Fruit juices.  Whole milk products.  Sodas.  Drinks with caffeine (coffee, tea, soda) or alcohol.  Oral rehydration solution may be used if the doctor says it is okay. You may make your own solution. Follow this recipe:   - teaspoon table salt.   teaspoon baking soda.   teaspoon salt substitute containing potassium chloride.  1 tablespoons sugar.  1 liter (34 ounces) of water.  Avoid the following foods:  High fiber foods, such as raw fruits and vegetables.  Nuts, seeds, and whole grain breads and cereals.   Those that are sweetened with sugar alcohols (xylitol, sorbitol, mannitol).  Try eating the following foods:  Starchy foods, such as rice, toast, pasta, low-sugar cereal, oatmeal, baked potatoes, crackers, and bagels.  Bananas.  Applesauce.  Eat probiotic-rich foods, such as yogurt and milk products that are fermented.  Wash your hands well after each time you have watery poop.  Only take medicine as told by your doctor.  Take a warm bath to help lessen burning or pain from having watery poop. GET HELP RIGHT AWAY IF:   You cannot drink fluids without throwing up (vomiting).  You keep throwing up.  You have blood in your poop, or your poop looks black and tarry.  You do not pee (urinate) in 6-8 hours, or there is only a small amount of very dark pee.  You have belly (abdominal) pain that gets worse or stays in the same spot  (localizes).  You are weak, dizzy, confused, or light-headed.  You have a very bad headache.  Your watery poop gets worse or does not get better.  You have a fever or lasting symptoms for more than 2-3 days.  You have a fever and your symptoms suddenly get worse. MAKE SURE YOU:   Understand these instructions.  Will watch your condition.  Will get help right away if you are not doing well or get worse. Document Released: 07/29/2007 Document Revised: 06/26/2013 Document Reviewed: 10/18/2011 [facility] Patient Information 2015 [place], [place]. This information is not intended to replace advice given to you by your health care provider. Make sure you discuss any questions you have with your health care provider.

## 2013-12-10 NOTE — ED Provider Notes (Signed)
CSN: [id number]     Arrival date & time 12/10/13  1128 History  This chart was scribed for Tanna Furry, MD by Tula Nakayama, ED Scribe. This patient was seen in room APA09/APA09 and the patient's care was started at 12:09 PM.     Chief Complaint  Patient presents with  . Weakness   The history is provided by the patient. No language interpreter was used.   HPI Comments: [name] is a 68 y.o. female who presents to the Emergency Department complaining of constant weakness that started 2 days ago. Pt states subjective fever, chills, diarrhea, nausea, dizziness when she stands, and pinpoint left-sided CP as associated symptoms. She denies vomiting, abdominal pain, hematochezia, muscle aches, and changes in color of urine or stool. Pt started taking Lipitor on Thursday after seeing her PCP. Pt states she ate at the [facility] prior to the onset of symptoms.  Past Medical History  Diagnosis Date  . Hypertension   . High cholesterol    History reviewed. No pertinent past surgical history. No family history on file. History  Substance Use Topics  . Smoking status: Never Smoker   . Smokeless tobacco: Not on file  . Alcohol Use: No   OB History   Grav Para Term Preterm Abortions TAB SAB Ect Mult Living                 Review of Systems  Constitutional: Positive for fever and chills. Negative for diaphoresis, appetite change and fatigue.  HENT: Negative for mouth sores, sore throat and trouble swallowing.   Eyes: Negative for visual disturbance.  Respiratory: Negative for cough, chest tightness, shortness of breath and wheezing.   Cardiovascular: Positive for chest pain.  Gastrointestinal: Positive for nausea and diarrhea. Negative for vomiting, abdominal pain, blood in stool and abdominal distention.  Endocrine: Negative for polydipsia, polyphagia and polyuria.  Genitourinary: Negative for dysuria, frequency and hematuria.  Musculoskeletal: Negative for gait problem and myalgias.   Skin: Negative for color change, pallor and rash.  Neurological: Positive for dizziness. Negative for syncope, light-headedness and headaches.  Hematological: Does not bruise/bleed easily.  Psychiatric/Behavioral: Negative for behavioral problems and confusion.      Allergies  Review of patient's allergies indicates no known allergies.  Home Medications   Prior to Admission medications   Medication Sig Start Date End Date Taking? Authorizing Provider  aspirin EC 81 MG tablet Take 81 mg by mouth daily.   Yes Historical Provider, MD  valsartan-hydrochlorothiazide (DIOVAN-HCT) 160-25 MG per tablet Take 1 tablet by mouth daily.   Yes Historical Provider, MD  cephALEXin (KEFLEX) 500 MG capsule Take 1 capsule (500 mg total) by mouth 3 (three) times daily. 12/12/13   Sharyon Cable, MD  diphenoxylate-atropine (LOMOTIL) 2.5-0.025 MG per tablet Take 1 tablet by mouth 4 (four) times daily as needed for diarrhea or loose stools. 12/10/13   Tanna Furry, MD  ondansetron (ZOFRAN ODT) 4 MG disintegrating tablet Take 1 tablet (4 mg total) by mouth every 8 (eight) hours as needed for nausea. 12/10/13   Tanna Furry, MD   BP 126/79  Pulse 96  Temp(Src) 97.8 F (36.6 C) (Oral)  Resp 18  Ht 5\' 2"  (1.575 m)  Wt 124 lb (56.246 kg)  BMI 22.67 kg/m2  SpO2 100% Physical Exam  Nursing note and vitals reviewed. Constitutional: She is oriented to person, place, and time. She appears well-developed and well-nourished. No distress.  HENT:  Head: Normocephalic.  Eyes: Conjunctivae are normal. Pupils  are equal, round, and reactive to light. No scleral icterus.  Neck: Normal range of motion. Neck supple. No thyromegaly present.  Cardiovascular: Normal rate and regular rhythm.  Exam reveals no gallop and no friction rub.   No murmur heard. Pulmonary/Chest: Effort normal and breath sounds normal. No respiratory distress. She has no wheezes. She has no rales.  Abdominal: Soft. Bowel sounds are normal. She  exhibits no distension. There is no tenderness. There is no rebound.  Mild tenderness but no peritoneal irritation.  Musculoskeletal: Normal range of motion.  Neurological: She is alert and oriented to person, place, and time.  Skin: Skin is warm and dry. No rash noted.  Psychiatric: She has a normal mood and affect. Her behavior is normal.    ED Course  Procedures (including critical care time) DIAGNOSTIC STUDIES: Oxygen Saturation is 100% on RA, normal by my interpretation.    COORDINATION OF CARE: 12:13 PM Discussed treatment plan with pt at bedside and pt agreed to plan.  Labs Review Labs Reviewed  CBC WITH DIFFERENTIAL - Abnormal; Notable for the following:    WBC 12.3 (*)    Hemoglobin 11.0 (*)    HCT 32.8 (*)    MCV 76.8 (*)    MCH 25.8 (*)    Neutrophils Relative % 89 (*)    Neutro Abs 11.0 (*)    Lymphocytes Relative 5 (*)    Lymphs Abs 0.6 (*)    All other components within normal limits  BASIC METABOLIC PANEL - Abnormal; Notable for the following:    Sodium 136 (*)    Potassium 3.1 (*)    Chloride 95 (*)    Glucose, Bld 128 (*)    BUN 33 (*)    Creatinine, Ser 1.40 (*)    GFR calc non Af Amer 38 (*)    GFR calc Af Amer 44 (*)    Anion gap 16 (*)    All other components within normal limits  CK    Imaging Review Dg Chest 2 View  12/12/2013   CLINICAL DATA:  Shortness of breath and weakness for the past 5 days.  EXAM: CHEST  2 VIEW  COMPARISON:  10/28/2008.  FINDINGS: The cardiac silhouette, mediastinal and hilar contours are normal and stable. The lungs are clear of infiltrate, effusion or edema. There is peribronchial thickening and slight increased interstitial marking which could suggest bronchitis. The bony thorax is intact.  IMPRESSION: Findings suggest bronchitis.  No focal infiltrates   Electronically Signed   By: Kalman Jewels M.D.   On: 12/12/2013 11:34     EKG Interpretation None      MDM   Final diagnoses:  Diarrhea  Dehydration     Patient improving after fluids. Potassium slightly low 3.1. Creatinine 1.4. Think she is appropriate for outpatient treatment. Made aware of the slight elevation of her creatinine. Have asked her to recheck with her primary care physician reevaluation after completion of her acute illness. Return to emergency with any additional difficulties with nausea or vomiting or diarrhea. Continue to orally rehydrate at home.   Tanna Furry, MD 12/13/13 [phone]

## 2013-12-10 NOTE — ED Notes (Signed)
Discharge instructions and prescriptions given and reviewed with patient.  Patient verbalized understanding to take medication as directed.  Patient to follow up with PMD as directed.  Patient ambulatory; discharged home in good condition.

## 2013-12-12 ENCOUNTER — Emergency Department (HOSPITAL_COMMUNITY): Payer: Medicare Other

## 2013-12-12 ENCOUNTER — Encounter (HOSPITAL_COMMUNITY): Payer: Self-pay | Admitting: Emergency Medicine

## 2013-12-12 ENCOUNTER — Emergency Department (HOSPITAL_COMMUNITY)
Admission: EM | Admit: 2013-12-12 | Discharge: 2013-12-12 | Disposition: A | Discharge status: Home / Self Care | Payer: Medicare Other | Attending: Emergency Medicine | Admitting: Emergency Medicine

## 2013-12-12 DIAGNOSIS — R5383 Other fatigue: Secondary | ICD-10-CM | POA: Insufficient documentation

## 2013-12-12 DIAGNOSIS — E780 Pure hypercholesterolemia: Secondary | ICD-10-CM | POA: Insufficient documentation

## 2013-12-12 DIAGNOSIS — B9689 Other specified bacterial agents as the cause of diseases classified elsewhere: Secondary | ICD-10-CM | POA: Diagnosis not present

## 2013-12-12 DIAGNOSIS — R110 Nausea: Secondary | ICD-10-CM | POA: Insufficient documentation

## 2013-12-12 DIAGNOSIS — I10 Essential (primary) hypertension: Secondary | ICD-10-CM | POA: Diagnosis not present

## 2013-12-12 DIAGNOSIS — R197 Diarrhea, unspecified: Secondary | ICD-10-CM | POA: Diagnosis not present

## 2013-12-12 DIAGNOSIS — R531 Weakness: Secondary | ICD-10-CM | POA: Diagnosis not present

## 2013-12-12 DIAGNOSIS — Z79899 Other long term (current) drug therapy: Secondary | ICD-10-CM | POA: Diagnosis not present

## 2013-12-12 DIAGNOSIS — R079 Chest pain, unspecified: Secondary | ICD-10-CM | POA: Insufficient documentation

## 2013-12-12 DIAGNOSIS — R7989 Other specified abnormal findings of blood chemistry: Secondary | ICD-10-CM | POA: Diagnosis not present

## 2013-12-12 DIAGNOSIS — N390 Urinary tract infection, site not specified: Secondary | ICD-10-CM

## 2013-12-12 DIAGNOSIS — Z7982 Long term (current) use of aspirin: Secondary | ICD-10-CM | POA: Diagnosis not present

## 2013-12-12 DIAGNOSIS — R945 Abnormal results of liver function studies: Secondary | ICD-10-CM

## 2013-12-12 DIAGNOSIS — R0602 Shortness of breath: Secondary | ICD-10-CM | POA: Diagnosis not present

## 2013-12-12 LAB — CBC WITH DIFFERENTIAL/PLATELET
BASOS PCT: 0 % (ref 0–1)
Basophils Absolute: 0 10*3/uL (ref 0.0–0.1)
EOS PCT: 6 % — AB (ref 0–5)
Eosinophils Absolute: 0.4 10*3/uL (ref 0.0–0.7)
HEMATOCRIT: 32.3 % — AB (ref 36.0–46.0)
Hemoglobin: 10.9 g/dL — ABNORMAL LOW (ref 12.0–15.0)
Lymphocytes Relative: 11 % — ABNORMAL LOW (ref 12–46)
Lymphs Abs: 0.7 10*3/uL (ref 0.7–4.0)
MCH: 26 pg (ref 26.0–34.0)
MCHC: 33.7 g/dL (ref 30.0–36.0)
MCV: 76.9 fL — AB (ref 78.0–100.0)
MONO ABS: 0.3 10*3/uL (ref 0.1–1.0)
Monocytes Relative: 5 % (ref 3–12)
NEUTROS ABS: 5.3 10*3/uL (ref 1.7–7.7)
Neutrophils Relative %: 78 % — ABNORMAL HIGH (ref 43–77)
Platelets: 249 10*3/uL (ref 150–400)
RBC: 4.2 MIL/uL (ref 3.87–5.11)
RDW: 14.6 % (ref 11.5–15.5)
WBC: 6.8 10*3/uL (ref 4.0–10.5)

## 2013-12-12 LAB — URINALYSIS, ROUTINE W REFLEX MICROSCOPIC
Glucose, UA: NEGATIVE mg/dL
Nitrite: NEGATIVE
Protein, ur: NEGATIVE mg/dL
Specific Gravity, Urine: 1.015 (ref 1.005–1.030)
Urobilinogen, UA: 1 mg/dL (ref 0.0–1.0)
pH: 6 (ref 5.0–8.0)

## 2013-12-12 LAB — URINE MICROSCOPIC-ADD ON

## 2013-12-12 LAB — COMPREHENSIVE METABOLIC PANEL
ALBUMIN: 3.1 g/dL — AB (ref 3.5–5.2)
ALT: 218 U/L — ABNORMAL HIGH (ref 0–35)
ANION GAP: 11 (ref 5–15)
AST: 108 U/L — ABNORMAL HIGH (ref 0–37)
Alkaline Phosphatase: 432 U/L — ABNORMAL HIGH (ref 39–117)
BUN: 10 mg/dL (ref 6–23)
CO2: 30 meq/L (ref 19–32)
CREATININE: 0.99 mg/dL (ref 0.50–1.10)
Calcium: 9.4 mg/dL (ref 8.4–10.5)
Chloride: 97 mEq/L (ref 96–112)
GFR calc Af Amer: 67 mL/min — ABNORMAL LOW (ref >90)
GFR, EST NON AFRICAN AMERICAN: 58 mL/min — AB (ref >90)
Glucose, Bld: 98 mg/dL (ref 70–99)
Potassium: 3.1 mEq/L — ABNORMAL LOW (ref 3.7–5.3)
Sodium: 138 mEq/L (ref 137–147)
Total Bilirubin: 2.1 mg/dL — ABNORMAL HIGH (ref 0.3–1.2)
Total Protein: 7.8 g/dL (ref 6.0–8.3)

## 2013-12-12 LAB — CK: Total CK: 132 U/L (ref 7–177)

## 2013-12-12 LAB — TROPONIN I

## 2013-12-12 MED ORDER — CEPHALEXIN 500 MG PO CAPS: 500.0000 mg | ORAL_CAPSULE | Freq: Three times a day (TID) | ORAL | Status: DC

## 2013-12-12 MED ORDER — POVIDONE-IODINE 10 % EX SOLN
CUTANEOUS | Status: AC
  Filled 2013-12-12: qty 118

## 2013-12-12 MED ORDER — SODIUM CHLORIDE 0.9 % IV BOLUS (SEPSIS)
1000.0000 mL | Freq: Once | INTRAVENOUS | Status: AC
  Administered 2013-12-12: 1000 mL via INTRAVENOUS

## 2013-12-12 MED ORDER — DEXTROSE 5 % IV SOLN
1.0000 g | Freq: Once | INTRAVENOUS | Status: AC
  Administered 2013-12-12: 1 g via INTRAVENOUS
  Filled 2013-12-12: qty 10

## 2013-12-12 NOTE — ED Notes (Signed)
Pt states has noticed generalized weakness since Fri 12/08/13, seen in ED on Sun 12/10/13 for the same, pt currently denies N/V/D, states has no appetite, has been drinking a lot of water, weakness has not improved.

## 2013-12-12 NOTE — ED Provider Notes (Signed)
CSN: [id number]     Arrival date & time 12/12/13  4174 History  This chart was scribed for Sharyon Cable, MD by Ludger Nutting, ED Scribe. This patient was seen in room APA04/APA04 and the patient's care was started 10:13 AM.    Chief Complaint  Patient presents with  . Weakness   Patient is a 68 y.o. female presenting with weakness. The history is provided by the patient. No language interpreter was used.  Weakness This is a recurrent problem. The current episode started more than 2 days ago. The problem occurs constantly. The problem has not changed since onset.Associated symptoms include chest pain. Pertinent negatives include no abdominal pain, no headaches and no shortness of breath.    HPI Comments: [name] is a 68 y.o. female who presents to the Emergency Department complaining of constant, generalized weakness with associated intermittent nausea and lightheadedness that initially began 4 days ago but returned yesterday. She reports being seen on 12/10/13 after having a few days of diarrhea. She reports her symptoms improved after her visit to the ED but worsened again yesterday. She complains intermittent left sided chest pain that lasts for a few seconds for the past few days. She states the pain is only present with walking and movement. She denies SOB or change with deep breathing. She reports starting a new cholesterol medication last week and has had generalized itching since then. She denies HA, vomiting, back pain, abdominal pain, cough, slurred speech, unilateral weakness, dysuria, hematochezia. She has an appointment with PCP tonight.   [facility]  Past Medical History  Diagnosis Date  . Hypertension   . High cholesterol    History reviewed. No pertinent past surgical history. History reviewed. No pertinent family history. History  Substance Use Topics  . Smoking status: Never Smoker   . Smokeless tobacco: Not on file  . Alcohol Use: No   OB History   Grav Para  Term Preterm Abortions TAB SAB Ect Mult Living                 Review of Systems  Respiratory: Negative for cough and shortness of breath.   Cardiovascular: Positive for chest pain.  Gastrointestinal: Positive for nausea and diarrhea. Negative for vomiting, abdominal pain and blood in stool.  Genitourinary: Negative for dysuria.  Neurological: Positive for weakness (generalized) and light-headedness. Negative for dizziness, speech difficulty and headaches.  All other systems reviewed and are negative.     Allergies  Review of patient's allergies indicates no known allergies.  Home Medications   Prior to Admission medications   Medication Sig Start Date End Date Taking? Authorizing Provider  aspirin EC 81 MG tablet Take 81 mg by mouth daily.   Yes Historical Provider, MD  diphenoxylate-atropine (LOMOTIL) 2.5-0.025 MG per tablet Take 1 tablet by mouth 4 (four) times daily as needed for diarrhea or loose stools. 12/10/13  Yes Tanna Furry, MD  ondansetron (ZOFRAN ODT) 4 MG disintegrating tablet Take 1 tablet (4 mg total) by mouth every 8 (eight) hours as needed for nausea. 12/10/13  Yes Tanna Furry, MD  valsartan-hydrochlorothiazide (DIOVAN-HCT) 160-25 MG per tablet Take 1 tablet by mouth daily.   Yes Historical Provider, MD  atorvastatin (LIPITOR) 10 MG tablet Take 10 mg by mouth daily.    Historical Provider, MD   BP 128/59  Pulse 81  Temp(Src) 99.1 F (37.3 C) (Oral)  Resp 16  Ht 5\' 2"  (1.575 m)  Wt 120 lb (54.432 kg)  BMI 21.94  kg/m2  SpO2 99% Physical Exam  Nursing note and vitals reviewed.  CONSTITUTIONAL: Well developed/well nourished HEAD: Normocephalic/atraumatic EYES: EOMI/PERRL, no nystagmus, no icterus ENMT: Mucous membranes moist NECK: supple no meningeal signs, no bruits CV: S1/S2 noted, no murmurs/rubs/gallops noted LUNGS: Lungs are clear to auscultation bilaterally, no apparent distress ABDOMEN: soft, nontender, no rebound or guarding GU:no cva  tenderness NEURO:Awake/alert, facies symmetric, no arm or leg drift is noted Equal 5/5 strength with shoulder abduction, elbow flex/extension, wrist flex/extension in upper extremities and equal hand grips bilaterally Equal 5/5 strength with hip flexion,knee flex/extension, foot dorsi/plantar flexion Cranial nerves 3/4/5/6/08/31/08/11/12 tested and intact Gait normal without ataxia No past pointing Sensation to light touch intact in all extremities EXTREMITIES: pulses normal, full ROM SKIN: warm, color normal PSYCH: no abnormalities of mood noted  ED Course  Procedures   DIAGNOSTIC STUDIES: Oxygen Saturation is 99% on RA, normal by my interpretation.    COORDINATION OF CARE: 10:20 AM Discussed treatment plan with pt at bedside and pt agreed to plan.  12:07 PM Pt is improved She is well appearing UTI noted Also noted to have abnormal LFT - this could be due to recent lipitor She will be treated for UTI and f/u with PCP in one week to have recheck of LFTs Pt is otherwise appropriate/well appearing for discharge home Labs Review Labs Reviewed  CBC WITH DIFFERENTIAL - Abnormal; Notable for the following:    Hemoglobin 10.9 (*)    HCT 32.3 (*)    MCV 76.9 (*)    Neutrophils Relative % 78 (*)    Lymphocytes Relative 11 (*)    Eosinophils Relative 6 (*)    All other components within normal limits  COMPREHENSIVE METABOLIC PANEL - Abnormal; Notable for the following:    Potassium 3.1 (*)    Albumin 3.1 (*)    AST 108 (*)    ALT 218 (*)    Alkaline Phosphatase 432 (*)    Total Bilirubin 2.1 (*)    GFR calc non Af Amer 58 (*)    GFR calc Af Amer 67 (*)    All other components within normal limits  URINALYSIS, ROUTINE W REFLEX MICROSCOPIC - Abnormal; Notable for the following:    Hgb urine dipstick TRACE (*)    Bilirubin Urine MODERATE (*)    Ketones, ur TRACE (*)    Leukocytes, UA LARGE (*)    All other components within normal limits  URINE MICROSCOPIC-ADD ON - Abnormal;  Notable for the following:    Bacteria, UA FEW (*)    All other components within normal limits  URINE CULTURE  TROPONIN I  CK    Imaging Review Dg Chest 2 View  12/12/2013   CLINICAL DATA:  Shortness of breath and weakness for the past 5 days.  EXAM: CHEST  2 VIEW  COMPARISON:  10/28/2008.  FINDINGS: The cardiac silhouette, mediastinal and hilar contours are normal and stable. The lungs are clear of infiltrate, effusion or edema. There is peribronchial thickening and slight increased interstitial marking which could suggest bronchitis. The bony thorax is intact.  IMPRESSION: Findings suggest bronchitis.  No focal infiltrates   Electronically Signed   By: Kalman Jewels M.D.   On: 12/12/2013 11:34      Date: 12/12/2013 1102am  Rate: 85  Rhythm: normal sinus rhythm  QRS Axis: normal  Intervals: normal  ST/T Wave abnormalities: nonspecific ST changes  Conduction Disutrbances:none    MDM   Final diagnoses:  UTI (lower urinary tract infection)  Other fatigue  Elevated liver function tests    Nursing notes including past medical history and social history reviewed and considered in documentation Previous records reviewed and considered Labs/vital reviewed and considered xrays reviewed and considered   I personally performed the services described in this documentation, which was scribed in my presence. The recorded information has been reviewed and is accurate.      Sharyon Cable, MD 12/12/13 [phone]

## 2013-12-13 LAB — URINE CULTURE
Colony Count: NO GROWTH
Culture: NO GROWTH

## 2013-12-24 ENCOUNTER — Emergency Department (HOSPITAL_COMMUNITY)
Admission: EM | Admit: 2013-12-24 | Discharge: 2013-12-24 | Disposition: A | Discharge status: Home / Self Care | Payer: Medicare Other | Attending: Emergency Medicine | Admitting: Emergency Medicine

## 2013-12-24 ENCOUNTER — Encounter (HOSPITAL_COMMUNITY): Payer: Self-pay

## 2013-12-24 DIAGNOSIS — R21 Rash and other nonspecific skin eruption: Secondary | ICD-10-CM | POA: Diagnosis not present

## 2013-12-24 DIAGNOSIS — Z87440 Personal history of urinary (tract) infections: Secondary | ICD-10-CM | POA: Diagnosis not present

## 2013-12-24 DIAGNOSIS — Z792 Long term (current) use of antibiotics: Secondary | ICD-10-CM | POA: Diagnosis not present

## 2013-12-24 DIAGNOSIS — L509 Urticaria, unspecified: Secondary | ICD-10-CM | POA: Insufficient documentation

## 2013-12-24 DIAGNOSIS — Y929 Unspecified place or not applicable: Secondary | ICD-10-CM | POA: Diagnosis not present

## 2013-12-24 DIAGNOSIS — Z7982 Long term (current) use of aspirin: Secondary | ICD-10-CM | POA: Insufficient documentation

## 2013-12-24 DIAGNOSIS — Z79899 Other long term (current) drug therapy: Secondary | ICD-10-CM | POA: Diagnosis not present

## 2013-12-24 DIAGNOSIS — I10 Essential (primary) hypertension: Secondary | ICD-10-CM | POA: Diagnosis not present

## 2013-12-24 DIAGNOSIS — Z8639 Personal history of other endocrine, nutritional and metabolic disease: Secondary | ICD-10-CM | POA: Insufficient documentation

## 2013-12-24 DIAGNOSIS — Y939 Activity, unspecified: Secondary | ICD-10-CM | POA: Diagnosis not present

## 2013-12-24 DIAGNOSIS — T7840XA Allergy, unspecified, initial encounter: Secondary | ICD-10-CM | POA: Insufficient documentation

## 2013-12-24 DIAGNOSIS — E860 Dehydration: Secondary | ICD-10-CM

## 2013-12-24 DIAGNOSIS — R531 Weakness: Secondary | ICD-10-CM | POA: Diagnosis present

## 2013-12-24 LAB — COMPREHENSIVE METABOLIC PANEL
ALBUMIN: 3.4 g/dL — AB (ref 3.5–5.2)
ALT: 20 U/L (ref 0–35)
AST: 24 U/L (ref 0–37)
Alkaline Phosphatase: 233 U/L — ABNORMAL HIGH (ref 39–117)
Anion gap: 17 — ABNORMAL HIGH (ref 5–15)
BILIRUBIN TOTAL: 1 mg/dL (ref 0.3–1.2)
BUN: 33 mg/dL — ABNORMAL HIGH (ref 6–23)
CHLORIDE: 99 meq/L (ref 96–112)
CO2: 22 mEq/L (ref 19–32)
CREATININE: 1.28 mg/dL — AB (ref 0.50–1.10)
Calcium: 9.2 mg/dL (ref 8.4–10.5)
GFR calc Af Amer: 49 mL/min — ABNORMAL LOW (ref >90)
GFR calc non Af Amer: 43 mL/min — ABNORMAL LOW (ref >90)
Glucose, Bld: 132 mg/dL — ABNORMAL HIGH (ref 70–99)
Potassium: 3.4 mEq/L — ABNORMAL LOW (ref 3.7–5.3)
Sodium: 138 mEq/L (ref 137–147)
Total Protein: 8.1 g/dL (ref 6.0–8.3)

## 2013-12-24 LAB — CBC WITH DIFFERENTIAL/PLATELET
BASOS ABS: 0 10*3/uL (ref 0.0–0.1)
BASOS PCT: 0 % (ref 0–1)
Eosinophils Absolute: 0 10*3/uL (ref 0.0–0.7)
Eosinophils Relative: 1 % (ref 0–5)
HCT: 37.1 % (ref 36.0–46.0)
Hemoglobin: 12.2 g/dL (ref 12.0–15.0)
Lymphocytes Relative: 15 % (ref 12–46)
Lymphs Abs: 1.2 10*3/uL (ref 0.7–4.0)
MCH: 25.3 pg — ABNORMAL LOW (ref 26.0–34.0)
MCHC: 32.9 g/dL (ref 30.0–36.0)
MCV: 76.8 fL — ABNORMAL LOW (ref 78.0–100.0)
Monocytes Absolute: 0.1 10*3/uL (ref 0.1–1.0)
Monocytes Relative: 1 % — ABNORMAL LOW (ref 3–12)
NEUTROS ABS: 7.1 10*3/uL (ref 1.7–7.7)
Neutrophils Relative %: 83 % — ABNORMAL HIGH (ref 43–77)
Platelets: 515 10*3/uL — ABNORMAL HIGH (ref 150–400)
RBC: 4.83 MIL/uL (ref 3.87–5.11)
RDW: 15.1 % (ref 11.5–15.5)
WBC: 8.5 10*3/uL (ref 4.0–10.5)

## 2013-12-24 MED ORDER — FAMOTIDINE IN NACL 20-0.9 MG/50ML-% IV SOLN
20.0000 mg | Freq: Once | INTRAVENOUS | Status: AC
  Administered 2013-12-24: 20 mg via INTRAVENOUS
  Filled 2013-12-24: qty 50

## 2013-12-24 MED ORDER — PREDNISONE 10 MG PO TABS: 20.0000 mg | ORAL_TABLET | Freq: Every day | ORAL | Status: DC

## 2013-12-24 MED ORDER — EPINEPHRINE 0.3 MG/0.3ML IJ SOAJ
0.3000 mg | Freq: Once | INTRAMUSCULAR | Status: AC
  Administered 2013-12-24: 0.3 mg via INTRAMUSCULAR
  Filled 2013-12-24: qty 0.3

## 2013-12-24 MED ORDER — DIPHENHYDRAMINE HCL 50 MG/ML IJ SOLN
50.0000 mg | Freq: Once | INTRAMUSCULAR | Status: AC
  Administered 2013-12-24: 50 mg via INTRAVENOUS
  Filled 2013-12-24: qty 1

## 2013-12-24 MED ORDER — FAMOTIDINE 20 MG PO TABS: 20.0000 mg | ORAL_TABLET | Freq: Two times a day (BID) | ORAL | Status: DC

## 2013-12-24 MED ORDER — METHYLPREDNISOLONE SODIUM SUCC 125 MG IJ SOLR
125.0000 mg | Freq: Once | INTRAMUSCULAR | Status: AC
  Administered 2013-12-24: 125 mg via INTRAVENOUS
  Filled 2013-12-24: qty 2

## 2013-12-24 MED ORDER — SODIUM CHLORIDE 0.9 % IV BOLUS (SEPSIS)
1000.0000 mL | Freq: Once | INTRAVENOUS | Status: AC
  Administered 2013-12-24: 1000 mL via INTRAVENOUS

## 2013-12-24 NOTE — ED Provider Notes (Signed)
CSN: [id number]     Arrival date & time 12/24/13  1857 History  This chart was scribed for Anita Diego, MD by Dellis Filbert, ED Scribe. The patient was seen in APA12/APA12 and the patient's care was started at 8:43 PM.    Chief Complaint  Patient presents with  . Weakness   Patient is a 68 y.o. female presenting with weakness and rash.  Weakness Pertinent negatives include no chest pain, no abdominal pain and no headaches.  Rash Location:  Full body Quality: itchiness and redness   Severity:  Mild Onset quality:  Sudden Duration:  2 days Timing:  Constant Chronicity:  New Relieved by:  None tried Ineffective treatments:  None tried Associated symptoms: fatigue   Associated symptoms: no abdominal pain, no diarrhea and no headaches     HPI Comments: [name] is a 68 y.o. female who presents to the Emergency Department complaining of generalized itching and fatigue. The rash began yesterday. She also notes she tried to go church this morning and was unable to because she felt tired. She was given abx for a UTI and finished it Friday Pt also is given medication for her HTN which she last took two weeks ago. She denies diarrhea.   Past Medical History  Diagnosis Date  . Hypertension   . High cholesterol    Past Surgical History  Procedure Laterality Date  . Laparoscopic hysterectomy     No family history on file. History  Substance Use Topics  . Smoking status: Never Smoker   . Smokeless tobacco: Not on file  . Alcohol Use: No   OB History    No data available     Review of Systems  Constitutional: Positive for fatigue. Negative for appetite change.  HENT: Negative for congestion, ear discharge and sinus pressure.   Eyes: Negative for discharge.  Respiratory: Negative for cough.   Cardiovascular: Negative for chest pain.  Gastrointestinal: Negative for abdominal pain and diarrhea.  Genitourinary: Negative for frequency and hematuria.  Musculoskeletal:  Negative for back pain.  Skin: Positive for rash.  Neurological: Positive for weakness. Negative for seizures and headaches.  Psychiatric/Behavioral: Negative for hallucinations.      Allergies  Review of patient's allergies indicates no known allergies.  Home Medications   Prior to Admission medications   Medication Sig Start Date End Date Taking? Authorizing Provider  aspirin EC 81 MG tablet Take 81 mg by mouth daily.    Historical Provider, MD  cephALEXin (KEFLEX) 500 MG capsule Take 1 capsule (500 mg total) by mouth 3 (three) times daily. 12/12/13   Sharyon Cable, MD  diphenoxylate-atropine (LOMOTIL) 2.5-0.025 MG per tablet Take 1 tablet by mouth 4 (four) times daily as needed for diarrhea or loose stools. 12/10/13   Tanna Furry, MD  ondansetron (ZOFRAN ODT) 4 MG disintegrating tablet Take 1 tablet (4 mg total) by mouth every 8 (eight) hours as needed for nausea. 12/10/13   Tanna Furry, MD  valsartan-hydrochlorothiazide (DIOVAN-HCT) 160-25 MG per tablet Take 1 tablet by mouth daily.    Historical Provider, MD   BP 92/54 mmHg  Pulse 100  Temp(Src) 98.2 F (36.8 C) (Oral)  Resp 20  Ht 5\' 2"  (1.575 m)  Wt 124 lb (56.246 kg)  BMI 22.67 kg/m2  SpO2 100% Physical Exam  Constitutional: She is oriented to person, place, and time. She appears well-developed.  HENT:  Head: Normocephalic.  Eyes: Conjunctivae and EOM are normal. No scleral icterus.  Neck: Neck  supple. No thyromegaly present.  Cardiovascular: Normal rate and regular rhythm.  Exam reveals no gallop and no friction rub.   No murmur heard. Pulmonary/Chest: No stridor. She has no wheezes. She has no rales. She exhibits no tenderness.  Abdominal: She exhibits no distension. There is no tenderness. There is no rebound.  Musculoskeletal: Normal range of motion. She exhibits no edema.  Lymphadenopathy:    She has no cervical adenopathy.  Neurological: She is oriented to person, place, and time. She exhibits normal muscle  tone. Coordination normal.  Skin: No rash noted. No erythema.  urticarial rash all over her body  Psychiatric: She has a normal mood and affect. Her behavior is normal.    ED Course  Procedures  DIAGNOSTIC STUDIES: Oxygen Saturation is 100% on room air, normal by my interpretation.    COORDINATION OF CARE: 8:46 PM Discussed treatment plan with pt at bedside and pt agreed to plan.   Labs Review Labs Reviewed - No data to display  Imaging Review No results found.   EKG Interpretation None      MDM   Final diagnoses:  None     The chart was scribed for me under my direct supervision.  I personally performed the history, physical, and medical decision making and all procedures in the evaluation of this patient.Anita Diego, MD 12/24/13 2322

## 2013-12-24 NOTE — ED Notes (Signed)
Patient c/o of generalized itchy generalized rash; pt admits to using a new laundry soap the last few days.

## 2013-12-24 NOTE — ED Notes (Signed)
Pt alert & oriented x4, stable gait. Patient given discharge instructions, paperwork & prescription(s). Patient  instructed to stop at the registration desk to finish any additional paperwork. Patient verbalized understanding. Pt left department w/ no further questions. 

## 2013-12-24 NOTE — ED Notes (Signed)
Patient complaining of generalized weakness and falls. Fell around 2-3 times today. Also complaining of rash all over her body and chest pain - intermittently.

## 2013-12-24 NOTE — ED Notes (Signed)
Patient rash much improved, barely noticeable. Patient states she feels much better.

## 2013-12-24 NOTE — Discharge Instructions (Signed)
Take benadryl every 4-6 hours for itching

## 2013-12-25 ENCOUNTER — Emergency Department (HOSPITAL_COMMUNITY)
Admission: EM | Admit: 2013-12-25 | Discharge: 2013-12-25 | Disposition: A | Discharge status: Home / Self Care | Payer: Medicare Other | Attending: Emergency Medicine | Admitting: Emergency Medicine

## 2013-12-25 ENCOUNTER — Encounter (HOSPITAL_COMMUNITY): Payer: Self-pay | Admitting: *Deleted

## 2013-12-25 DIAGNOSIS — R220 Localized swelling, mass and lump, head: Secondary | ICD-10-CM | POA: Insufficient documentation

## 2013-12-25 DIAGNOSIS — Z7982 Long term (current) use of aspirin: Secondary | ICD-10-CM | POA: Diagnosis not present

## 2013-12-25 DIAGNOSIS — Z792 Long term (current) use of antibiotics: Secondary | ICD-10-CM | POA: Insufficient documentation

## 2013-12-25 DIAGNOSIS — T7840XA Allergy, unspecified, initial encounter: Secondary | ICD-10-CM | POA: Diagnosis not present

## 2013-12-25 DIAGNOSIS — I10 Essential (primary) hypertension: Secondary | ICD-10-CM | POA: Insufficient documentation

## 2013-12-25 DIAGNOSIS — T380X5A Adverse effect of glucocorticoids and synthetic analogues, initial encounter: Secondary | ICD-10-CM | POA: Insufficient documentation

## 2013-12-25 DIAGNOSIS — Y9389 Activity, other specified: Secondary | ICD-10-CM | POA: Diagnosis not present

## 2013-12-25 DIAGNOSIS — Y929 Unspecified place or not applicable: Secondary | ICD-10-CM | POA: Insufficient documentation

## 2013-12-25 DIAGNOSIS — T470X5A Adverse effect of histamine H2-receptor blockers, initial encounter: Secondary | ICD-10-CM | POA: Insufficient documentation

## 2013-12-25 DIAGNOSIS — R21 Rash and other nonspecific skin eruption: Secondary | ICD-10-CM | POA: Insufficient documentation

## 2013-12-25 DIAGNOSIS — Z79899 Other long term (current) drug therapy: Secondary | ICD-10-CM | POA: Diagnosis not present

## 2013-12-25 DIAGNOSIS — Z7952 Long term (current) use of systemic steroids: Secondary | ICD-10-CM | POA: Insufficient documentation

## 2013-12-25 DIAGNOSIS — R197 Diarrhea, unspecified: Secondary | ICD-10-CM | POA: Diagnosis not present

## 2013-12-25 MED ORDER — DIPHENHYDRAMINE HCL 50 MG/ML IJ SOLN
50.0000 mg | Freq: Once | INTRAMUSCULAR | Status: AC
  Administered 2013-12-25: 50 mg via INTRAMUSCULAR
  Filled 2013-12-25: qty 1

## 2013-12-25 NOTE — ED Notes (Signed)
Seen here yesterday for rash, and low bp, Now having swelling of upper lip Alert, NAD,  Ate salmon today.

## 2013-12-25 NOTE — ED Provider Notes (Signed)
CSN: [id number]     Arrival date & time 12/25/13  1802 History  This chart was scribed for Maudry Diego, MD by Dellis Filbert, ED Scribe. The patient was seen in APA07/APA07 and the patient's care was started at 6:38 PM.  No chief complaint on file.  Patient is a 68 y.o. female presenting with allergic reaction. The history is provided by the patient. No language interpreter was used.  Allergic Reaction Presenting symptoms: rash and swelling   Rash:    Location:  Full body   Quality: redness     Severity:  Mild Severity:  Mild Prior allergic episodes:  No prior episodes Context: medications   Relieved by:  Nothing Worsened by:  Nothing tried Ineffective treatments:  None tried  HPI Comments: [name] is a 68 y.o. female with a history of HTN who presents to the Emergency Department complaining of an allergic reaction. She states she took prednisone and pepcid and her lips began swelling up and her eyes reddened. Pt states here right arm is swelling as well. Pt was here yesterday for a head to toe rash which has improved. Pt does not know what her HTN medication is but states she has been using it for months. She denies tongue swelling.  Past Medical History  Diagnosis Date  . Hypertension   . High cholesterol    Past Surgical History  Procedure Laterality Date  . Laparoscopic hysterectomy     History reviewed. No pertinent family history. History  Substance Use Topics  . Smoking status: Never Smoker   . Smokeless tobacco: Not on file  . Alcohol Use: No   OB History    No data available     Review of Systems  Constitutional: Negative for appetite change and fatigue.  HENT: Positive for facial swelling. Negative for congestion, ear discharge and sinus pressure.   Eyes: Positive for redness. Negative for discharge.  Respiratory: Negative for cough.   Cardiovascular: Negative for chest pain.  Gastrointestinal: Negative for abdominal pain and diarrhea.   Genitourinary: Negative for frequency and hematuria.  Musculoskeletal: Negative for back pain.  Skin: Positive for rash.  Neurological: Negative for seizures and headaches.  Psychiatric/Behavioral: Negative for hallucinations.      Allergies  Review of patient's allergies indicates no known allergies.  Home Medications   Prior to Admission medications   Medication Sig Start Date End Date Taking? Authorizing Provider  aspirin EC 81 MG tablet Take 81 mg by mouth daily.    Historical Provider, MD  cephALEXin (KEFLEX) 500 MG capsule Take 1 capsule (500 mg total) by mouth 3 (three) times daily. 12/12/13   Sharyon Cable, MD  diphenhydrAMINE (BENADRYL) 25 MG tablet Take 25 mg by mouth every 6 (six) hours as needed for allergies.    Historical Provider, MD  diphenoxylate-atropine (LOMOTIL) 2.5-0.025 MG per tablet Take 1 tablet by mouth 4 (four) times daily as needed for diarrhea or loose stools. 12/10/13   Tanna Furry, MD  famotidine (PEPCID) 20 MG tablet Take 1 tablet (20 mg total) by mouth 2 (two) times daily. 12/24/13   Maudry Diego, MD  ondansetron (ZOFRAN ODT) 4 MG disintegrating tablet Take 1 tablet (4 mg total) by mouth every 8 (eight) hours as needed for nausea. 12/10/13   Tanna Furry, MD  predniSONE (DELTASONE) 10 MG tablet Take 2 tablets (20 mg total) by mouth daily. 12/24/13   Maudry Diego, MD  valsartan-hydrochlorothiazide (DIOVAN-HCT) 160-25 MG per tablet Take 1 tablet  by mouth daily.    Historical Provider, MD   BP 149/51 mmHg  Pulse 95  Temp(Src) 98.3 F (36.8 C) (Oral)  Resp 18  Ht 5\' 2"  (1.575 m)  Wt 125 lb (56.7 kg)  BMI 22.86 kg/m2  SpO2 97% Physical Exam  Constitutional: She is oriented to person, place, and time. She appears well-developed.  HENT:  Head: Normocephalic.  Upper lip is swollen   Eyes: Conjunctivae and EOM are normal. No scleral icterus.  Neck: Neck supple. No thyromegaly present.  Cardiovascular: Normal rate and regular rhythm.  Exam reveals  no gallop and no friction rub.   No murmur heard. Pulmonary/Chest: No stridor. She has no wheezes. She has no rales. She exhibits no tenderness.  Abdominal: She exhibits no distension. There is no tenderness. There is no rebound.  Musculoskeletal: Normal range of motion. She exhibits no edema.  Lymphadenopathy:    She has no cervical adenopathy.  Neurological: She is oriented to person, place, and time. She exhibits normal muscle tone. Coordination normal.  Skin: No rash noted. No erythema.  Improved rash from head to toe.  Psychiatric: She has a normal mood and affect. Her behavior is normal.    ED Course  Procedures  DIAGNOSTIC STUDIES: Oxygen Saturation is 97% on room air, adequate by my interpretation.    COORDINATION OF CARE: 6:43 PM Discussed treatment plan with pt at bedside and pt agreed to plan.  Labs Review Labs Reviewed - No data to display  Imaging Review No results found.   EKG Interpretation None      MDM   Final diagnoses:  None   Swelling to lip most likely caused by bp meds.   Pt to stop diovan and follow up with pcp this week.   The chart was scribed for me under my direct supervision.  I personally performed the history, physical, and medical decision making and all procedures in the evaluation of this patient.Maudry Diego, MD 12/25/13 2025

## 2013-12-25 NOTE — Discharge Instructions (Signed)
Continue your present medicines.  Continue benadryl every 4 hours as  Needed.  Do not take your bp med.   Follow up with your md this week.

## 2014-01-12 DIAGNOSIS — N390 Urinary tract infection, site not specified: Secondary | ICD-10-CM | POA: Diagnosis not present

## 2014-01-12 DIAGNOSIS — I10 Essential (primary) hypertension: Secondary | ICD-10-CM | POA: Diagnosis not present

## 2014-01-12 DIAGNOSIS — E785 Hyperlipidemia, unspecified: Secondary | ICD-10-CM | POA: Diagnosis not present

## 2014-01-30 DIAGNOSIS — Z0000 Encounter for general adult medical examination without abnormal findings: Secondary | ICD-10-CM | POA: Diagnosis not present

## 2014-06-19 DIAGNOSIS — I10 Essential (primary) hypertension: Secondary | ICD-10-CM | POA: Diagnosis not present

## 2014-06-19 DIAGNOSIS — N390 Urinary tract infection, site not specified: Secondary | ICD-10-CM | POA: Diagnosis not present

## 2014-08-07 DIAGNOSIS — I10 Essential (primary) hypertension: Secondary | ICD-10-CM | POA: Diagnosis not present

## 2014-08-07 DIAGNOSIS — E785 Hyperlipidemia, unspecified: Secondary | ICD-10-CM | POA: Diagnosis not present

## 2014-11-14 ENCOUNTER — Other Ambulatory Visit: Payer: Self-pay | Admitting: Dentistry

## 2014-11-14 DIAGNOSIS — R6884 Jaw pain: Secondary | ICD-10-CM

## 2014-11-19 DIAGNOSIS — I10 Essential (primary) hypertension: Secondary | ICD-10-CM | POA: Diagnosis not present

## 2014-11-21 ENCOUNTER — Ambulatory Visit
Admission: RE | Admit: 2014-11-21 | Discharge: 2014-11-21 | Disposition: A | Discharge status: Home / Self Care | Payer: Medicare Other | Source: Ambulatory Visit | Attending: Dentistry | Admitting: Dentistry

## 2014-11-21 DIAGNOSIS — R6884 Jaw pain: Secondary | ICD-10-CM

## 2014-11-21 DIAGNOSIS — K1379 Other lesions of oral mucosa: Secondary | ICD-10-CM | POA: Diagnosis not present

## 2014-11-21 MED ORDER — IOPAMIDOL (ISOVUE-300) INJECTION 61%
75.0000 mL | Freq: Once | INTRAVENOUS | Status: AC | PRN
  Administered 2014-11-21: 75 mL via INTRAVENOUS

## 2014-11-22 ENCOUNTER — Other Ambulatory Visit (HOSPITAL_COMMUNITY): Payer: Self-pay | Admitting: Family Medicine

## 2014-11-22 DIAGNOSIS — Z1231 Encounter for screening mammogram for malignant neoplasm of breast: Secondary | ICD-10-CM

## 2014-11-29 ENCOUNTER — Ambulatory Visit (HOSPITAL_COMMUNITY)
Admission: RE | Admit: 2014-11-29 | Discharge: 2014-11-29 | Disposition: A | Discharge status: Home / Self Care | Payer: Medicare Other | Source: Ambulatory Visit | Attending: Family Medicine | Admitting: Family Medicine

## 2014-11-29 DIAGNOSIS — Z1231 Encounter for screening mammogram for malignant neoplasm of breast: Secondary | ICD-10-CM | POA: Diagnosis not present

## 2015-03-25 DIAGNOSIS — E785 Hyperlipidemia, unspecified: Secondary | ICD-10-CM | POA: Diagnosis not present

## 2015-03-25 DIAGNOSIS — I10 Essential (primary) hypertension: Secondary | ICD-10-CM | POA: Diagnosis not present

## 2015-03-25 DIAGNOSIS — Z6824 Body mass index (BMI) 24.0-24.9, adult: Secondary | ICD-10-CM | POA: Diagnosis not present

## 2015-08-06 DIAGNOSIS — H2512 Age-related nuclear cataract, left eye: Secondary | ICD-10-CM | POA: Diagnosis not present

## 2015-08-06 DIAGNOSIS — H3589 Other specified retinal disorders: Secondary | ICD-10-CM | POA: Diagnosis not present

## 2015-08-06 DIAGNOSIS — H25811 Combined forms of age-related cataract, right eye: Secondary | ICD-10-CM | POA: Diagnosis not present

## 2015-08-06 DIAGNOSIS — H04123 Dry eye syndrome of bilateral lacrimal glands: Secondary | ICD-10-CM | POA: Diagnosis not present

## 2015-10-05 DIAGNOSIS — I10 Essential (primary) hypertension: Secondary | ICD-10-CM | POA: Diagnosis not present

## 2015-10-21 ENCOUNTER — Encounter (HOSPITAL_COMMUNITY): Payer: Self-pay | Admitting: Nurse Practitioner

## 2015-10-21 ENCOUNTER — Ambulatory Visit (HOSPITAL_COMMUNITY)
Admission: EM | Admit: 2015-10-21 | Discharge: 2015-10-21 | Disposition: A | Discharge status: Home / Self Care | Payer: Medicare Other | Attending: Internal Medicine | Admitting: Internal Medicine

## 2015-10-21 DIAGNOSIS — H6091 Unspecified otitis externa, right ear: Secondary | ICD-10-CM | POA: Diagnosis not present

## 2015-10-21 DIAGNOSIS — H6123 Impacted cerumen, bilateral: Secondary | ICD-10-CM

## 2015-10-21 DIAGNOSIS — H918X9 Other specified hearing loss, unspecified ear: Secondary | ICD-10-CM

## 2015-10-21 MED ORDER — SULFAMETHOXAZOLE-TRIMETHOPRIM 800-160 MG PO TABS: 1.0000 | ORAL_TABLET | Freq: Two times a day (BID) | ORAL | 0 refills | Status: AC

## 2015-10-21 MED ORDER — NEOMYCIN-POLYMYXIN-HC 3.5-10000-1 OT SUSP: 4.0000 [drp] | Freq: Three times a day (TID) | OTIC | 0 refills | Status: DC

## 2015-10-21 NOTE — Discharge Instructions (Signed)
Use ear drops.  Can fill and start prescription for trimethoprim/sulfa on 8/30 or 8/31 if neck/R ear discomfort are not starting to improve.

## 2015-10-21 NOTE — ED Triage Notes (Addendum)
Pt c/o 2 day history bilateral ear pain. She denies fevers, cough, nasal congestion, sore throat. She tried allergy relief medicine with no relief. She is alert and breathing easily.

## 2015-10-21 NOTE — ED Provider Notes (Signed)
[place]    CSN: [id number] Arrival date & time: 10/21/15  1426  First Provider Contact:  First MD Initiated Contact with Patient 10/21/15 1551        History   Chief Complaint Chief Complaint  Patient presents with  . Otalgia    HPI [name] is a 70 y.o. female. She presents today with a couple days history of right ear discomfort and decreased hearing. Discomfort in the right ear radiates down into the right anterior neck. She believes a gland is swollen. Denies cough, no runny/congested nose, no sore throat, no fever. Would like an antibiotic because of the neck discomfort.  HPI  Past Medical History:  Diagnosis Date  . High cholesterol   . Hypertension     There are no active problems to display for this patient.   Past Surgical History:  Procedure Laterality Date  . LAPAROSCOPIC HYSTERECTOMY        Home Medications    Prior to Admission medications   Medication Sig Start Date End Date Taking? Authorizing Provider  aspirin EC 81 MG tablet Take 81 mg by mouth daily.    Historical Provider, MD  diphenhydrAMINE (BENADRYL) 25 MG tablet Take 25 mg by mouth every 6 (six) hours as needed for allergies.    Historical Provider, MD  neomycin-polymyxin-hydrocortisone (CORTISPORIN) 3.5-10000-1 otic suspension Place 4 drops into the right ear 3 (three) times daily. 10/21/15   Sherlene Shams, MD  sulfamethoxazole-trimethoprim (BACTRIM DS,SEPTRA DS) 800-160 MG tablet Take 1 tablet by mouth 2 (two) times daily. Start if R ear ache/neck discomfort are not improving in a few days (8/30 or 8/31). 10/21/15 10/28/15  Sherlene Shams, MD  valsartan-hydrochlorothiazide (DIOVAN-HCT) 160-25 MG per tablet Take 1 tablet by mouth daily.    Historical Provider, MD    Family History History reviewed. No pertinent family history.  Social History Social History  Substance Use Topics  . Smoking status: Never Smoker  . Smokeless tobacco: Never Used  . Alcohol use No      Allergies   Review of patient's allergies indicates no known allergies.   Review of Systems Review of Systems  All other systems reviewed and are negative.    Physical Exam Triage Vital Signs ED Triage Vitals  Enc Vitals Group     BP 10/21/15 1540 162/85     Pulse Rate 10/21/15 1540 71     Resp 10/21/15 1540 12     Temp 10/21/15 1540 98.4 F (36.9 C)     Temp Source 10/21/15 1540 Oral     SpO2 10/21/15 1540 99 %     Weight --      Height --      Pain Score 10/21/15 1555 7    Updated Vital Signs BP 162/85 (BP Location: Right Arm)   Pulse 71   Temp 98.4 F (36.9 C) (Oral)   Resp 12   SpO2 99%  Physical Exam  Constitutional: She is oriented to person, place, and time. No distress.  Alert, nicely groomed  HENT:  Head: Atraumatic.  Before ear irrigation, right ear canal is slightly reddened, with some discomfort with movement of the outer ear. Post-irrigation, right ear canal is red, with minor redness of the TM, and some dullness. Left TM is dull, and canal is waxy. Minimal nasal congestion.  Eyes:  Conjugate gaze, no eye redness/drainage  Neck: Neck supple.  Cardiovascular: Normal rate.   Pulmonary/Chest: No respiratory distress.  Abdominal: She exhibits no distension.  Musculoskeletal: Normal range of motion.  No leg swelling  Neurological: She is alert and oriented to person, place, and time.  Skin: Skin is warm and dry.  No cyanosis  Nursing note and vitals reviewed.    UC Treatments / Results   Procedures Procedures (including critical care time)  Bilateral ear irrigation performed by clinical staff, with removal of large amounts of wax. Good result.  Final Clinical Impressions(s) / UC Diagnoses   Final diagnoses:  Right otitis externa  Bilateral hearing loss due to cerumen impaction    New Prescriptions Discharge Medication List as of 10/21/2015  4:27 PM    START taking these medications   Details  neomycin-polymyxin-hydrocortisone  (CORTISPORIN) 3.5-10000-1 otic suspension Place 4 drops into the right ear 3 (three) times daily., Starting Mon 10/21/2015, Normal    sulfamethoxazole-trimethoprim (BACTRIM DS,SEPTRA DS) 800-160 MG tablet Take 1 tablet by mouth 2 (two) times daily. Start if R ear ache/neck discomfort are not improving in a few days (8/30 or 8/31)., Starting Mon 10/21/2015, Until Mon 10/28/2015, Print         Sherlene Shams, MD 11/03/15 1326

## 2015-10-22 ENCOUNTER — Emergency Department (HOSPITAL_COMMUNITY)
Admission: EM | Admit: 2015-10-22 | Discharge: 2015-10-22 | Disposition: A | Discharge status: Home / Self Care | Payer: Medicare Other | Attending: Emergency Medicine | Admitting: Emergency Medicine

## 2015-10-22 ENCOUNTER — Encounter (HOSPITAL_COMMUNITY): Payer: Self-pay

## 2015-10-22 ENCOUNTER — Emergency Department (HOSPITAL_COMMUNITY): Payer: Medicare Other

## 2015-10-22 DIAGNOSIS — H9201 Otalgia, right ear: Secondary | ICD-10-CM | POA: Insufficient documentation

## 2015-10-22 DIAGNOSIS — Z7982 Long term (current) use of aspirin: Secondary | ICD-10-CM | POA: Diagnosis not present

## 2015-10-22 DIAGNOSIS — Z79899 Other long term (current) drug therapy: Secondary | ICD-10-CM | POA: Diagnosis not present

## 2015-10-22 DIAGNOSIS — I10 Essential (primary) hypertension: Secondary | ICD-10-CM | POA: Diagnosis not present

## 2015-10-22 DIAGNOSIS — R220 Localized swelling, mass and lump, head: Secondary | ICD-10-CM | POA: Diagnosis not present

## 2015-10-22 MED ORDER — HYDROCODONE-ACETAMINOPHEN 5-325 MG PO TABS: 2.0000 | ORAL_TABLET | ORAL | 0 refills | Status: DC | PRN

## 2015-10-22 MED ORDER — HYDROCODONE-ACETAMINOPHEN 5-325 MG PO TABS
2.0000 | ORAL_TABLET | Freq: Once | ORAL | Status: AC
  Administered 2015-10-22: 2 via ORAL
  Filled 2015-10-22: qty 2

## 2015-10-22 NOTE — Discharge Instructions (Signed)
See Dr. Berdine Addison for recheck in 2-3 days.  Continue current ear drops and

## 2015-10-22 NOTE — ED Triage Notes (Signed)
Pt reports r earache and was seen here yesterday.  Reports had some earwax removed and was put on bactrim po and neomycin ear drops.

## 2015-10-22 NOTE — ED Provider Notes (Signed)
[facility] DEPT Provider Note   CSN: [id number] Arrival date & time: 10/22/15  1230     History   Chief Complaint Chief Complaint  Patient presents with  . Otalgia    HPI [name] is a 70 y.o. female.  The history is provided by the patient. No language interpreter was used.  Otalgia  This is a new problem. There is pain in the right ear. The problem occurs constantly. The problem has been gradually worsening. There has been no fever. The pain is moderate. Pertinent negatives include no ear discharge. Her past medical history does not include hearing loss.  Pt complains of pain in her right ear and in her face.  Pt went to urgent care yesterday.  Pt had wax washed out of ear.  She was placed on an antibiotic and ear drops.  Pt reports swelling today  Past Medical History:  Diagnosis Date  . High cholesterol   . Hypertension     There are no active problems to display for this patient.   Past Surgical History:  Procedure Laterality Date  . LAPAROSCOPIC HYSTERECTOMY      OB History    No data available       Home Medications    Prior to Admission medications   Medication Sig Start Date End Date Taking? Authorizing Provider  aspirin EC 81 MG tablet Take 81 mg by mouth daily.    Historical Provider, MD  diphenhydrAMINE (BENADRYL) 25 MG tablet Take 25 mg by mouth every 6 (six) hours as needed for allergies.    Historical Provider, MD  neomycin-polymyxin-hydrocortisone (CORTISPORIN) 3.5-10000-1 otic suspension Place 4 drops into the right ear 3 (three) times daily. 10/21/15   Sherlene Shams, MD  sulfamethoxazole-trimethoprim (BACTRIM DS,SEPTRA DS) 800-160 MG tablet Take 1 tablet by mouth 2 (two) times daily. Start if R ear ache/neck discomfort are not improving in a few days (8/30 or 8/31). 10/21/15 10/28/15  Sherlene Shams, MD  valsartan-hydrochlorothiazide (DIOVAN-HCT) 160-25 MG per tablet Take 1 tablet by mouth daily.    Historical Provider, MD    Family  History No family history on file.  Social History Social History  Substance Use Topics  . Smoking status: Never Smoker  . Smokeless tobacco: Never Used  . Alcohol use No     Allergies   Review of patient's allergies indicates no known allergies.   Review of Systems Review of Systems  HENT: Positive for ear pain. Negative for ear discharge.   All other systems reviewed and are negative.    Physical Exam Updated Vital Signs BP 145/61 (BP Location: Left Arm)   Pulse 84   Temp 99 F (37.2 C) (Oral)   Resp 16   Ht 5\' 2"  (1.575 m)   Wt 56.7 kg   SpO2 100%   BMI 22.86 kg/m   Physical Exam  Constitutional: She appears well-developed and well-nourished. No distress.  HENT:  Head: Normocephalic and atraumatic. Raccoon's eyes: right tm normal,  canal erythematous.  Right Ear: External ear normal.  Left Ear: External ear normal.  Nose: Nose normal.  Mouth/Throat: Oropharynx is clear and moist.  Swelling under right side of chin. Firm to palpation    Eyes: Conjunctivae are normal.  Neck: Neck supple.  Cardiovascular: Normal rate and regular rhythm.   No murmur heard. Pulmonary/Chest: Effort normal and breath sounds normal. No respiratory distress.  Abdominal: Soft. There is no tenderness.  Musculoskeletal: She exhibits no edema.  Neurological: She is alert.  Skin: Skin is warm and dry.  Psychiatric: She has a normal mood and affect.  Nursing note and vitals reviewed.    ED Treatments / Results  Labs (all labs ordered are listed, but only abnormal results are displayed) Labs Reviewed - No data to display  EKG  EKG Interpretation None       Radiology Ct Maxillofacial Wo Contrast  Result Date: 10/22/2015 CLINICAL DATA:  Right facial swelling and neck pain started 10/20/2015. EXAM: CT MAXILLOFACIAL WITHOUT CONTRAST TECHNIQUE: Multidetector CT imaging of the maxillofacial structures was performed. Multiplanar CT image reconstructions were also generated. A  small metallic BB was placed on the right temple in order to reliably differentiate right from left. COMPARISON:  None. FINDINGS: There is no soft tissue fluid collection or hematoma. There is no significant fat stranding along the right face and neck. The globes are intact. The orbital walls are intact. The orbital floors are intact. The maxilla is intact. The mandible is intact. The zygomatic arches are intact. The nasal septum is midline. There is no nasal bone fracture. There is mild osteoarthritis of bilateral temporomandibular joints. The paranasal sinuses are clear. The visualized portions of the mastoid sinuses are well aerated. Severe degenerative disc disease with disc height loss at C3-4. IMPRESSION: No significant cellulitis of the right side of the face. No fluid collection to suggest an abscess. Electronically Signed   By: Kathreen Devoid   On: 10/22/2015 14:09    Procedures Procedures (including critical care time)  Medications Ordered in ED Medications - No data to display   Initial Impression / Assessment and Plan / ED Course  I have reviewed the triage vital signs and the nursing notes.  Pertinent labs & imaging results that were available during my care of the patient were reviewed by me and considered in my medical decision making (see chart for details).  Clinical Course    Ct no abscess.   Pt advised to continue antibiotic and drops.   Final Clinical Impressions(s) / ED Diagnoses   Final diagnoses:  Otalgia of right ear    New Prescriptions New Prescriptions   No medications on file     Fransico Meadow, PA-C 10/22/15 1510    Elnora Morrison, MD 10/22/15 1530

## 2015-10-22 NOTE — ED Notes (Signed)
Patient transported to CT 

## 2015-10-29 DIAGNOSIS — E785 Hyperlipidemia, unspecified: Secondary | ICD-10-CM | POA: Diagnosis not present

## 2015-10-29 DIAGNOSIS — I10 Essential (primary) hypertension: Secondary | ICD-10-CM | POA: Diagnosis not present

## 2016-01-03 ENCOUNTER — Other Ambulatory Visit (HOSPITAL_COMMUNITY): Payer: Self-pay | Admitting: Family Medicine

## 2016-01-03 DIAGNOSIS — Z1231 Encounter for screening mammogram for malignant neoplasm of breast: Secondary | ICD-10-CM

## 2016-01-20 ENCOUNTER — Ambulatory Visit (HOSPITAL_COMMUNITY)
Admission: RE | Admit: 2016-01-20 | Discharge: 2016-01-20 | Disposition: A | Discharge status: Home / Self Care | Payer: Medicare Other | Source: Ambulatory Visit | Attending: Family Medicine | Admitting: Family Medicine

## 2016-01-20 DIAGNOSIS — Z1231 Encounter for screening mammogram for malignant neoplasm of breast: Secondary | ICD-10-CM | POA: Diagnosis not present

## 2016-08-11 DIAGNOSIS — H04123 Dry eye syndrome of bilateral lacrimal glands: Secondary | ICD-10-CM | POA: Diagnosis not present

## 2016-08-11 DIAGNOSIS — H2513 Age-related nuclear cataract, bilateral: Secondary | ICD-10-CM | POA: Diagnosis not present

## 2016-08-11 DIAGNOSIS — H16143 Punctate keratitis, bilateral: Secondary | ICD-10-CM | POA: Diagnosis not present

## 2016-09-02 DIAGNOSIS — I10 Essential (primary) hypertension: Secondary | ICD-10-CM | POA: Diagnosis not present

## 2016-09-14 ENCOUNTER — Other Ambulatory Visit: Payer: Self-pay

## 2016-10-19 DIAGNOSIS — Z0000 Encounter for general adult medical examination without abnormal findings: Secondary | ICD-10-CM | POA: Diagnosis not present

## 2016-12-11 ENCOUNTER — Other Ambulatory Visit (HOSPITAL_COMMUNITY): Payer: Self-pay | Admitting: Family Medicine

## 2016-12-11 DIAGNOSIS — Z1231 Encounter for screening mammogram for malignant neoplasm of breast: Secondary | ICD-10-CM

## 2017-01-02 DIAGNOSIS — E785 Hyperlipidemia, unspecified: Secondary | ICD-10-CM | POA: Diagnosis not present

## 2017-01-02 DIAGNOSIS — I10 Essential (primary) hypertension: Secondary | ICD-10-CM | POA: Diagnosis not present

## 2017-01-05 DIAGNOSIS — I10 Essential (primary) hypertension: Secondary | ICD-10-CM | POA: Diagnosis not present

## 2017-01-05 DIAGNOSIS — E785 Hyperlipidemia, unspecified: Secondary | ICD-10-CM | POA: Diagnosis not present

## 2017-01-20 ENCOUNTER — Ambulatory Visit (HOSPITAL_COMMUNITY)
Admission: RE | Admit: 2017-01-20 | Discharge: 2017-01-20 | Disposition: A | Discharge status: Home / Self Care | Payer: Medicare Other | Source: Ambulatory Visit | Attending: Family Medicine | Admitting: Family Medicine

## 2017-01-20 DIAGNOSIS — Z1231 Encounter for screening mammogram for malignant neoplasm of breast: Secondary | ICD-10-CM | POA: Diagnosis not present

## 2017-01-26 DIAGNOSIS — M1711 Unilateral primary osteoarthritis, right knee: Secondary | ICD-10-CM | POA: Diagnosis not present

## 2017-01-26 DIAGNOSIS — M222X1 Patellofemoral disorders, right knee: Secondary | ICD-10-CM | POA: Diagnosis not present

## 2017-05-03 DIAGNOSIS — E785 Hyperlipidemia, unspecified: Secondary | ICD-10-CM | POA: Diagnosis not present

## 2017-05-03 DIAGNOSIS — I10 Essential (primary) hypertension: Secondary | ICD-10-CM | POA: Diagnosis not present

## 2017-08-03 DIAGNOSIS — L739 Follicular disorder, unspecified: Secondary | ICD-10-CM | POA: Diagnosis not present

## 2017-08-09 DIAGNOSIS — I10 Essential (primary) hypertension: Secondary | ICD-10-CM | POA: Diagnosis not present

## 2017-08-09 DIAGNOSIS — E782 Mixed hyperlipidemia: Secondary | ICD-10-CM | POA: Diagnosis not present

## 2017-09-14 ENCOUNTER — Ambulatory Visit (INDEPENDENT_AMBULATORY_CARE_PROVIDER_SITE_OTHER): Payer: Self-pay

## 2017-09-14 ENCOUNTER — Ambulatory Visit: Payer: Medicare Other

## 2017-09-14 DIAGNOSIS — Z1211 Encounter for screening for malignant neoplasm of colon: Secondary | ICD-10-CM

## 2017-09-14 MED ORDER — NA SULFATE-K SULFATE-MG SULF 17.5-3.13-1.6 GM/177ML PO SOLN: 1.0000 | ORAL | 0 refills | Status: DC

## 2017-09-14 NOTE — Progress Notes (Signed)
Ok to schedule.

## 2017-09-14 NOTE — Progress Notes (Signed)
Gastroenterology Pre-Procedure Review  Request Date:09/14/17 Requesting Physician: Dr.Hill (last tcs 20+ years ago- [facility] ? Dr.Smith)  PATIENT REVIEW QUESTIONS: The patient responded to the following health history questions as indicated:    1. Diabetes Melitis: no 2. Joint replacements in the past 12 months: no 3. Major health problems in the past 3 months: no 4. Has an artificial valve or MVP: no 5. Has a defibrillator: no 6. Has been advised in past to take antibiotics in advance of a procedure like teeth cleaning: no 7. Family history of colon cancer: no  8. Alcohol Use: no 9. History of sleep apnea: no  10. History of coronary artery or other vascular stents placed within the last 12 months: no 11. History of any prior anesthesia complications: no    MEDICATIONS & ALLERGIES:    Patient reports the following regarding taking any blood thinners:   Plavix? no Aspirin? yes (81mg ) Coumadin? no Brilinta? no Xarelto? no Eliquis? no Pradaxa? no Savaysa? no Effient? no  Patient confirms/reports the following medications:  Current Outpatient Medications  Medication Sig Dispense Refill  . aspirin EC 81 MG tablet Take 81 mg by mouth daily.    . hydrochlorothiazide (MICROZIDE) 12.5 MG capsule daily.    . irbesartan (AVAPRO) 150 MG tablet daily.    . Multiple Vitamins-Minerals (MULTIVITAMIN GUMMIES ADULT PO) Take by mouth daily.    . rosuvastatin (CRESTOR) 5 MG tablet at bedtime.     No current facility-administered medications for this visit.     Patient confirms/reports the following allergies:  No Known Allergies  No orders of the defined types were placed in this encounter.   AUTHORIZATION INFORMATION Primary Insurance: medicare,  ID #:[id number] Pre-Cert / Auth required: no   SCHEDULE INFORMATION: Procedure has been scheduled as follows:  Date: 11/26/17, Time: 8:30 Location: [facility] Dr.Fields  This Gastroenterology Pre-Precedure Review Form is being routed to the  following provider(s): Neil Crouch, PA

## 2017-09-14 NOTE — Patient Instructions (Addendum)
[name]  1945/03/24 MRN: [id number]     Procedure Date: 01/28/18 Time to register: 7:30am Place to register: [facility] Procedure Time: 8:30am Scheduled provider: Barney Drain, MD    PREPARATION FOR COLONOSCOPY WITH SUPREP BOWEL PREP KIT  Note: Suprep Bowel Prep Kit is a split-dose (2day) regimen. Consumption of BOTH 6-ounce bottles is required for a complete prep.  Please notify us immediately if you are diabetic, take iron supplements, or if you are on Coumadin or any other blood thinners.                                                                                                                                                    1 DAY BEFORE PROCEDURE:  DATE: 01/27/18   DAY: Thursday  clear liquids the entire day - NO SOLID FOOD.     At 6:00pm: Complete steps 1 through 4 below, using ONE (1) 6-ounce bottle, before going to bed. Step 1:  Pour ONE (1) 6-ounce bottle of SUPREP liquid into the mixing container.  Step 2:  Add cool drinking water to the 16 ounce line on the container and mix.  Note: Dilute the solution concentrate as directed prior to use. Step 3:  DRINK ALL the liquid in the container. Step 4:  You MUST drink an additional two (2) or more 16 ounce containers of water over the next one (1) hour.   Continue clear liquids.  DAY OF PROCEDURE:   DATE: 01/28/18   DAY: Friday If you take medications for your heart, blood pressure, or breathing, you may take these medications.    5 hours before your procedure at 3:30am: Step 1:  Pour ONE (1) 6-ounce bottle of SUPREP liquid into the mixing container.  Step 2:  Add cool drinking water to the 16 ounce line on the container and mix.  Note: Dilute the solution concentrate as directed prior to use. Step 3:  DRINK ALL the liquid in the container. Step 4:  You MUST drink an additional two (2) or more 16 ounce containers of water over the next one (1) hour. You MUST complete the final glass of water at  least 3 hours before your colonoscopy.   Nothing by mouth past 5:30am  You may take your morning medications with sip of water unless we have instructed otherwise.    Please see below for Dietary Information.  CLEAR LIQUIDS INCLUDE:  Water Jello (NOT red in color)   Ice Popsicles (NOT red in color)   Tea (sugar ok, no milk/cream) Powdered fruit flavored drinks  Coffee (sugar ok, no milk/cream) Gatorade/ Lemonade/ Kool-Aid  (NOT red in color)   Juice: apple, white grape, white cranberry Soft drinks  Clear bullion, consomme, broth (fat free beef/chicken/vegetable)  Carbonated beverages (any kind)  Strained chicken noodle soup Hard Candy   Remember:  Clear liquids are liquids that will allow you to see your fingers on the other side of a clear glass. Be sure liquids are NOT red in color, and not cloudy, but CLEAR.  DO NOT EAT OR DRINK ANY OF THE FOLLOWING:  Dairy products of any kind   Cranberry juice Tomato juice / V8 juice   Grapefruit juice Orange juice     Red grape juice  Do not eat any solid foods, including such foods as: cereal, oatmeal, yogurt, fruits, vegetables, creamed soups, eggs, bread, crackers, pureed foods in a blender, etc.   HELPFUL HINTS FOR DRINKING PREP SOLUTION:   Make sure prep is extremely cold. Mix and refrigerate the the morning of the prep. You may also put in the freezer.   You may try mixing some Crystal Light or Country Time Lemonade if you prefer. Mix in small amounts; add more if necessary.  Try drinking through a straw  Rinse mouth with water or a mouthwash between glasses, to remove after-taste.  Try sipping on a cold beverage /ice/ popsicles between glasses of prep.  Place a piece of sugar-free hard candy in mouth between glasses.  If you become nauseated, try consuming smaller amounts, or stretch out the time between glasses. Stop for 30-60 minutes, then slowly start back drinking.     OTHER INSTRUCTIONS  You will need a responsible  adult at least 70 years of age to accompany you and drive you home. This person must remain in the waiting room during your procedure. The hospital will cancel your procedure if you do not have a responsible adult with you.   1. Wear loose fitting clothing that is easily removed. 2. Leave jewelry and other valuables at home.  3. Remove all body piercing jewelry and leave at home. 4. Total time from sign-in until discharge is approximately 2-3 hours. 5. You should go home directly after your procedure and rest. You can resume normal activities the day after your procedure. 6. The day of your procedure you should not:  Drive  Make legal decisions  Operate machinery  Drink alcohol  Return to work   You may call the office (Dept: [phone]) before 5:00pm, or page the doctor on call [phone]) after 5:00pm, for further instructions, if necessary.   Insurance Information YOU WILL NEED TO CHECK WITH YOUR INSURANCE COMPANY FOR THE BENEFITS OF COVERAGE YOU HAVE FOR THIS PROCEDURE.  UNFORTUNATELY, NOT ALL INSURANCE COMPANIES HAVE BENEFITS TO COVER ALL OR PART OF THESE TYPES OF PROCEDURES.  IT IS YOUR RESPONSIBILITY TO CHECK YOUR BENEFITS, HOWEVER, WE WILL BE GLAD TO ASSIST YOU WITH ANY CODES YOUR INSURANCE COMPANY MAY NEED.    PLEASE NOTE THAT MOST INSURANCE COMPANIES WILL NOT COVER A SCREENING COLONOSCOPY FOR PEOPLE UNDER THE AGE OF 50  IF YOU HAVE BCBS INSURANCE, YOU MAY HAVE BENEFITS FOR A SCREENING COLONOSCOPY BUT IF POLYPS ARE FOUND THE DIAGNOSIS WILL CHANGE AND THEN YOU MAY HAVE A DEDUCTIBLE THAT WILL NEED TO BE MET. SO PLEASE MAKE SURE YOU CHECK YOUR BENEFITS FOR A SCREENING COLONOSCOPY AS WELL AS A DIAGNOSTIC COLONOSCOPY.

## 2017-11-16 ENCOUNTER — Telehealth: Payer: Self-pay | Admitting: Gastroenterology

## 2017-11-16 NOTE — Telephone Encounter (Signed)
[phone]  Please call patient, she needs to reschedule her procedure

## 2017-11-17 NOTE — Telephone Encounter (Signed)
Called pt, rescheduled her to 01/28/18, I will mail new instructions to her. I have taken her off the schedule, called Hoyle Sauer and LM to reschedule.

## 2017-11-17 NOTE — Progress Notes (Signed)
pts husband had surgery and pt cannot do 11/26/17 appt for tcs. Rescheduled her to 01/28/18. Hoyle Sauer aware, new instructions mailed to the pt.

## 2017-12-11 DIAGNOSIS — R5383 Other fatigue: Secondary | ICD-10-CM | POA: Diagnosis not present

## 2017-12-11 DIAGNOSIS — I10 Essential (primary) hypertension: Secondary | ICD-10-CM | POA: Diagnosis not present

## 2017-12-11 DIAGNOSIS — R011 Cardiac murmur, unspecified: Secondary | ICD-10-CM | POA: Diagnosis not present

## 2017-12-11 DIAGNOSIS — G4762 Sleep related leg cramps: Secondary | ICD-10-CM | POA: Diagnosis not present

## 2017-12-28 DIAGNOSIS — Z6824 Body mass index (BMI) 24.0-24.9, adult: Secondary | ICD-10-CM | POA: Diagnosis not present

## 2017-12-28 DIAGNOSIS — E785 Hyperlipidemia, unspecified: Secondary | ICD-10-CM | POA: Diagnosis not present

## 2017-12-28 DIAGNOSIS — I10 Essential (primary) hypertension: Secondary | ICD-10-CM | POA: Diagnosis not present

## 2018-01-11 DIAGNOSIS — Z0000 Encounter for general adult medical examination without abnormal findings: Secondary | ICD-10-CM | POA: Diagnosis not present

## 2018-01-27 ENCOUNTER — Other Ambulatory Visit (HOSPITAL_COMMUNITY): Payer: Self-pay | Admitting: Family Medicine

## 2018-01-27 DIAGNOSIS — Z1231 Encounter for screening mammogram for malignant neoplasm of breast: Secondary | ICD-10-CM

## 2018-01-28 ENCOUNTER — Ambulatory Visit (HOSPITAL_COMMUNITY)
Admission: RE | Admit: 2018-01-28 | Discharge: 2018-01-28 | Disposition: A | Discharge status: Home / Self Care | Payer: Medicare Other | Source: Ambulatory Visit | Attending: Gastroenterology | Admitting: Gastroenterology

## 2018-01-28 ENCOUNTER — Other Ambulatory Visit: Payer: Self-pay

## 2018-01-28 ENCOUNTER — Encounter (HOSPITAL_COMMUNITY): Payer: Self-pay

## 2018-01-28 ENCOUNTER — Encounter (HOSPITAL_COMMUNITY)
Admission: RE | Disposition: A | Discharge status: Home / Self Care | Payer: Self-pay | Source: Ambulatory Visit | Attending: Gastroenterology

## 2018-01-28 DIAGNOSIS — Z1211 Encounter for screening for malignant neoplasm of colon: Secondary | ICD-10-CM

## 2018-01-28 DIAGNOSIS — K648 Other hemorrhoids: Secondary | ICD-10-CM | POA: Insufficient documentation

## 2018-01-28 DIAGNOSIS — Q438 Other specified congenital malformations of intestine: Secondary | ICD-10-CM | POA: Diagnosis not present

## 2018-01-28 DIAGNOSIS — K644 Residual hemorrhoidal skin tags: Secondary | ICD-10-CM | POA: Insufficient documentation

## 2018-01-28 DIAGNOSIS — Z79899 Other long term (current) drug therapy: Secondary | ICD-10-CM | POA: Diagnosis not present

## 2018-01-28 DIAGNOSIS — E7800 Pure hypercholesterolemia, unspecified: Secondary | ICD-10-CM | POA: Diagnosis not present

## 2018-01-28 DIAGNOSIS — D125 Benign neoplasm of sigmoid colon: Secondary | ICD-10-CM | POA: Insufficient documentation

## 2018-01-28 DIAGNOSIS — I10 Essential (primary) hypertension: Secondary | ICD-10-CM | POA: Diagnosis not present

## 2018-01-28 DIAGNOSIS — D120 Benign neoplasm of cecum: Secondary | ICD-10-CM | POA: Diagnosis not present

## 2018-01-28 DIAGNOSIS — D123 Benign neoplasm of transverse colon: Secondary | ICD-10-CM | POA: Diagnosis not present

## 2018-01-28 DIAGNOSIS — Z7982 Long term (current) use of aspirin: Secondary | ICD-10-CM | POA: Insufficient documentation

## 2018-01-28 DIAGNOSIS — D122 Benign neoplasm of ascending colon: Secondary | ICD-10-CM

## 2018-01-28 HISTORY — PX: COLONOSCOPY: SHX5424

## 2018-01-28 HISTORY — PX: POLYPECTOMY: SHX5525

## 2018-01-28 SURGERY — COLONOSCOPY
Anesthesia: Moderate Sedation

## 2018-01-28 MED ORDER — STERILE WATER FOR IRRIGATION IR SOLN
Status: DC | PRN
  Administered 2018-01-28: 1.5 mL

## 2018-01-28 MED ORDER — MEPERIDINE HCL 100 MG/ML IJ SOLN
INTRAMUSCULAR | Status: DC | PRN
  Administered 2018-01-28 (×2): 25 mg

## 2018-01-28 MED ORDER — SODIUM CHLORIDE 0.9 % IV SOLN
INTRAVENOUS | Status: DC
  Administered 2018-01-28: 08:00:00 via INTRAVENOUS

## 2018-01-28 MED ORDER — MEPERIDINE HCL 100 MG/ML IJ SOLN
INTRAMUSCULAR | Status: AC
  Filled 2018-01-28: qty 2

## 2018-01-28 MED ORDER — MIDAZOLAM HCL 5 MG/5ML IJ SOLN
INTRAMUSCULAR | Status: DC | PRN
  Administered 2018-01-28: 1 mg via INTRAVENOUS
  Administered 2018-01-28: 2 mg via INTRAVENOUS
  Administered 2018-01-28: 1 mg via INTRAVENOUS

## 2018-01-28 MED ORDER — MIDAZOLAM HCL 5 MG/5ML IJ SOLN
INTRAMUSCULAR | Status: AC
  Filled 2018-01-28: qty 10

## 2018-01-28 NOTE — Progress Notes (Signed)
This note also relates to the following rows which could not be included: Pulse Rate - Cannot attach notes to unvalidated device data ECG Heart Rate - Cannot attach notes to unvalidated device data Resp - Cannot attach notes to unvalidated device data BP - Cannot attach notes to unvalidated device data SpO2 - Cannot attach notes to unvalidated device data End Tidal CO2 (EtCO2) - Cannot attach notes to unvalidated device data

## 2018-01-28 NOTE — Progress Notes (Addendum)
Waste of 50 mg/ml of Demerol instead of 25 mg/ml in pyxis.  Corrected in pyxis.  Yuvonne Lanahan Rica Mote, RN With Clearnce Sorrel

## 2018-01-28 NOTE — H&P (Signed)
Primary Care Physician:  Iona Beard, MD Primary Gastroenterologist:  Dr. Oneida Alar  Pre-Procedure History & Physical: HPI:  [name] is a 72 y.o. female here for Eros.  Past Medical History:  Diagnosis Date  . High cholesterol   . Hypertension     Past Surgical History:  Procedure Laterality Date  . LAPAROSCOPIC HYSTERECTOMY      Prior to Admission medications   Medication Sig Start Date End Date Taking? Authorizing Provider  aspirin EC 81 MG tablet Take 81 mg by mouth at bedtime.    Yes [provider]  DM-APAP-CPM (CORICIDIN HBP PO) Take 1 tablet by mouth 2 (two) times daily as needed (for cold symptoms.).   Yes [provider]  hydrochlorothiazide (MICROZIDE) 12.5 MG capsule Take 12.5 mg by mouth at bedtime.  08/06/17  Yes [provider]  irbesartan (AVAPRO) 150 MG tablet Take 150 mg by mouth at bedtime.  08/06/17  Yes [provider]  Multiple Vitamins-Minerals (ADULT GUMMY PO) Take 2 tablets by mouth daily.   Yes [provider]  Na Sulfate-K Sulfate-Mg Sulf (SUPREP BOWEL PREP KIT) 17.5-3.13-1.6 GM/177ML SOLN Take 1 kit by mouth as directed. 09/14/17  Yes Mahala Menghini, PA-C  rosuvastatin (CRESTOR) 5 MG tablet Take 5 mg by mouth at bedtime.  08/19/17  Yes [provider]  atorvastatin (LIPITOR) 10 MG tablet Take 10 mg by mouth daily.  12/12/13  [provider]    Allergies as of 09/14/2017  . (No Known Allergies)    History reviewed. No pertinent family history.  Social History   Socioeconomic History  . Marital status: Married    Spouse name: Not on file  . Number of children: Not on file  . Years of education: Not on file  . Highest education level: Not on file  Occupational History  . Not on file  Social Needs  . Financial resource strain: Not on file  . Food insecurity:    Worry: Not on file    Inability: Not on file  . Transportation needs:    Medical: Not on file   Non-medical: Not on file  Tobacco Use  . Smoking status: Never Smoker  . Smokeless tobacco: Never Used  Substance and Sexual Activity  . Alcohol use: No  . Drug use: No  . Sexual activity: Yes    Birth control/protection: Surgical  Lifestyle  . Physical activity:    Days per week: Not on file    Minutes per session: Not on file  . Stress: Not on file  Relationships  . Social connections:    Talks on phone: Not on file    Gets together: Not on file    Attends religious service: Not on file    Active member of club or organization: Not on file    Attends meetings of clubs or organizations: Not on file    Relationship status: Not on file  . Intimate partner violence:    Fear of current or ex partner: Not on file    Emotionally abused: Not on file    Physically abused: Not on file    Forced sexual activity: Not on file  Other Topics Concern  . Not on file  Social History Narrative  . Not on file    Review of Systems: See HPI, otherwise negative ROS   Physical Exam: BP 137/75   Pulse 83   Temp 97.7 F (36.5 C) (Oral)   Resp 16   Ht 5'  1" (1.549 m)   Wt 57.6 kg   SpO2 100%   BMI 24.00 kg/m  General:   Alert,  pleasant and cooperative in NAD Head:  Normocephalic and atraumatic. Neck:  Supple; Lungs:  Clear throughout to auscultation.    Heart:  Regular rate and rhythm. Abdomen:  Soft, nontender and nondistended. Normal bowel sounds, without guarding, and without rebound.   Neurologic:  Alert and  oriented x4;  grossly normal neurologically.  Impression/Plan:    SCREENING  Plan:  1. TCS TODAY DISCUSSED PROCEDURE, BENEFITS, & RISKS: < 1% chance of medication reaction, bleeding, perforation, or rupture of spleen/liver.

## 2018-01-28 NOTE — Op Note (Addendum)
[facility] Patient Name: [name] Procedure Date: 01/28/2018 8:17 AM MRN: [id number] Date of Birth: [birth year] Attending MD: Barney Drain MD, MD CSN: [id number] Age: 72 Admit Type: Outpatient Procedure:                Colonoscopy with cold snare polypectomy Indications:              Screening for colorectal malignant neoplasm Providers:                Barney Drain MD, MD, Lurline Del, RN, Gerome Sam, RN, Randa Spike, Technician Referring MD:             Barrie Folk. Hill MD, MD Medicines:                Meperidine 50 mg IV, Midazolam 4 mg IV Complications:            No immediate complications. Estimated Blood Loss:     Estimated blood loss was minimal. Procedure:                Pre-Anesthesia Assessment:                           - Prior to the procedure, a History and Physical                            was performed, and patient medications and                            allergies were reviewed. The patient's tolerance of                            previous anesthesia was also reviewed. The risks                            and benefits of the procedure and the sedation                            options and risks were discussed with the patient.                            All questions were answered, and informed consent                            was obtained. Prior Anticoagulants: The patient has                            taken aspirin, last dose was 8 days prior to                            procedure. ASA Grade Assessment: II - A patient                            with mild systemic disease. After reviewing the  risks and benefits, the patient was deemed in                            satisfactory condition to undergo the procedure.                            After obtaining informed consent, the colonoscope                            was passed under direct vision. Throughout the   procedure, the patient's blood pressure, pulse, and                            oxygen saturations were monitored continuously. The                            CF-[id number] ([id number]) scope was introduced through                            the anus and advanced to the the cecum, identified                            by appendiceal orifice and ileocecal valve. The                            colonoscopy was technically difficult and complex                            due to significant looping. Successful completion                            of the procedure was aided by increasing the dose                            of sedation medication, changing the patient to a                            supine position, straightening and shortening the                            scope to obtain bowel loop reduction and COLOWRAP.                            The patient tolerated the procedure fairly well.                            The quality of the bowel preparation was good. The                            ileocecal valve, appendiceal orifice, and rectum                            were photographed. Scope In: 8:52:06 AM Scope Out: 9:19:35 AM Scope Withdrawal  Time: 0 hours 16 minutes 9 seconds  Total Procedure Duration: 0 hours 27 minutes 29 seconds  Findings:      Three sessile polyps were found in the sigmoid colon, hepatic flexure       and ascending colon. The polyps were 3 to 6 mm in size. These polyps       were removed with a cold snare. Resection and retrieval were complete.      The recto-sigmoid colon, sigmoid colon and descending colon were grossly       tortuous.      External and internal hemorrhoids were found. The hemorrhoids were small. Impression:               - Three 3 to 6 mm polyps in the sigmoid colon, at                            the hepatic flexure and in the ascending colon,                            removed with a cold snare. Resected and retrieved.                           -  Tortuous LEFT colon.                           - The examination was otherwise normal on direct                            and retroflexion views. Moderate Sedation:      Moderate (conscious) sedation was administered by the endoscopy nurse       and supervised by the endoscopist. The following parameters were       monitored: oxygen saturation, heart rate, blood pressure, and response       to care. Total physician intraservice time was 39 minutes. Recommendation:           - Patient has a contact number available for                            emergencies. The signs and symptoms of potential                            delayed complications were discussed with the                            patient. Return to normal activities tomorrow.                            Written discharge instructions were provided to the                            patient.                           - High fiber diet.                           -  Continue present medications.                           - Await pathology results.                           - Repeat colonoscopy in 3 years for surveillance. Procedure Code(s):        --- Professional ---                           [phone], Colonoscopy, flexible; with removal of                            tumor(s), polyp(s), or other lesion(s) by snare                            technique                           99153, Moderate sedation; each additional 15                            minutes intraservice time                           99153, Moderate sedation; each additional 15                            minutes intraservice time                           G0500, Moderate sedation services provided by the                            same physician or other qualified health care                            professional performing a gastrointestinal                            endoscopic service that sedation supports,                            requiring the presence of an  independent trained                            observer to assist in the monitoring of the                            patient's level of consciousness and physiological                            status; initial 15 minutes of intra-service time;                            patient age 23 years or older (additional time 104  be reported with [phone], as appropriate) Diagnosis Code(s):        --- Professional ---                           Z12.11, Encounter for screening for malignant                            neoplasm of colon                           D12.5, Benign neoplasm of sigmoid colon                           D12.3, Benign neoplasm of transverse colon (hepatic                            flexure or splenic flexure)                           D12.2, Benign neoplasm of ascending colon                           Q43.8, Other specified congenital malformations of                            intestine CPT copyright 2018 [employer]. All rights reserved. The codes documented in this report are preliminary and upon coder review may  be revised to meet current compliance requirements. Barney Drain, MD Barney Drain MD, MD 01/28/2018 9:46:24 AM This report has been signed electronically. Number of Addenda: 2 Addendum Number: 1   Addendum Date: 01/29/2018 10:39:47 AM                            Barney Drain, MD Barney Drain MD, MD 01/29/2018 10:39:54 AM This report has been signed electronically. Addendum Number: 2   Addendum Date: 01/29/2018 10:39:58 AM      NEXT COLONOSCOPY WITH A PEDS COLONOSCOPE. Barney Drain, MD Barney Drain MD, MD 01/29/2018 10:40:27 AM This report has been signed electronically.

## 2018-01-28 NOTE — Discharge Instructions (Signed)
You had 3 polyps removed. YOU HAVE A FLOPPY LEFT COLON.   DRINK WATER TO KEEP YOUR URINE LIGHT YELLOW.  FOLLOW A HIGH FIBER DIET. AVOID ITEMS THAT CAUSE BLOATING & GAS. SEE INFO BELOW.  YOUR BIOPSY RESULTS WILL BE BACK IN 5 BUSINESS DAYS.  Next colonoscopy in 3 years.    Colonoscopy Care After Read the instructions outlined below and refer to this sheet in the next week. These discharge instructions provide you with general information on caring for yourself after you leave the hospital. While your treatment has been planned according to the most current medical practices available, unavoidable complications occasionally occur. If you have any problems or questions after discharge, call DR. Tallula Grindle, [phone].  ACTIVITY  You may resume your regular activity, but move at a slower pace for the next 24 hours.   Take frequent rest periods for the next 24 hours.   Walking will help get rid of the air and reduce the bloated feeling in your belly (abdomen).   No driving for 24 hours (because of the medicine (anesthesia) used during the test).   You may shower.   Do not sign any important legal documents or operate any machinery for 24 hours (because of the anesthesia used during the test).    NUTRITION  Drink plenty of fluids.   You may resume your normal diet as instructed by your doctor.   Begin with a light meal and progress to your normal diet. Heavy or fried foods are harder to digest and may make you feel sick to your stomach (nauseated).   Avoid alcoholic beverages for 24 hours or as instructed.    MEDICATIONS  You may resume your normal medications.   WHAT YOU CAN EXPECT TODAY  Some feelings of bloating in the abdomen.   Passage of more gas than usual.   Spotting of blood in your stool or on the toilet paper  .  IF YOU HAD POLYPS REMOVED DURING THE COLONOSCOPY:  Eat a soft diet IF YOU HAVE NAUSEA, BLOATING, ABDOMINAL PAIN, OR VOMITING.    FINDING OUT  THE RESULTS OF YOUR TEST Not all test results are available during your visit. DR. Oneida Alar WILL CALL YOU WITHIN 14 DAYS OF YOUR PROCEDUE WITH YOUR RESULTS. Do not assume everything is normal if you have not heard from DR. Makaila Windle, CALL HER OFFICE AT [phone].  SEEK IMMEDIATE MEDICAL ATTENTION AND CALL THE OFFICE: [phone] IF:  You have more than a spotting of blood in your stool.   Your belly is swollen (abdominal distention).   You are nauseated or vomiting.   You have a temperature over 101F.   You have abdominal pain or discomfort that is severe or gets worse throughout the day.   High-Fiber Diet A high-fiber diet changes your normal diet to include more whole grains, legumes, fruits, and vegetables. Changes in the diet involve replacing refined carbohydrates with unrefined foods. The calorie level of the diet is essentially unchanged. The Dietary Reference Intake (recommended amount) for adult males is 38 grams per day. For adult females, it is 25 grams per day. Pregnant and lactating women should consume 28 grams of fiber per day. Fiber is the intact part of a plant that is not broken down during digestion. Functional fiber is fiber that has been isolated from the plant to provide a beneficial effect in the body. PURPOSE  Increase stool bulk.   Ease and regulate bowel movements.   Lower cholesterol.   REDUCE RISK OF COLON  CANCER  INDICATIONS THAT YOU NEED MORE FIBER  Constipation and hemorrhoids.   Uncomplicated diverticulosis (intestine condition) and irritable bowel syndrome.   Weight management.   As a protective measure against hardening of the arteries (atherosclerosis), diabetes, and cancer.   GUIDELINES FOR INCREASING FIBER IN THE DIET  Start adding fiber to the diet slowly. A gradual increase of about 5 more grams (2 slices of whole-wheat bread, 2 servings of most fruits or vegetables, or 1 bowl of high-fiber cereal) per day is best. Too rapid an increase  in fiber may result in constipation, flatulence, and bloating.   Drink enough water and fluids to keep your urine clear or pale yellow. Water, juice, or caffeine-free drinks are recommended. Not drinking enough fluid may cause constipation.   Eat a variety of high-fiber foods rather than one type of fiber.   Try to increase your intake of fiber through using high-fiber foods rather than fiber pills or supplements that contain small amounts of fiber.   The goal is to change the types of food eaten. Do not supplement your present diet with high-fiber foods, but replace foods in your present diet.   INCLUDE A VARIETY OF FIBER SOURCES  Replace refined and processed grains with whole grains, canned fruits with fresh fruits, and incorporate other fiber sources. White rice, white breads, and most bakery goods contain little or no fiber.   Brown whole-grain rice, buckwheat oats, and many fruits and vegetables are all good sources of fiber. These include: broccoli, Brussels sprouts, cabbage, cauliflower, beets, sweet potatoes, white potatoes (skin on), carrots, tomatoes, eggplant, squash, berries, fresh fruits, and dried fruits.   Cereals appear to be the richest source of fiber. Cereal fiber is found in whole grains and bran. Bran is the fiber-rich outer coat of cereal grain, which is largely removed in refining. In whole-grain cereals, the bran remains. In breakfast cereals, the largest amount of fiber is found in those with "bran" in their names. The fiber content is sometimes indicated on the label.   You may need to include additional fruits and vegetables each day.   In baking, for 1 cup white flour, you may use the following substitutions:   1 cup whole-wheat flour minus 2 tablespoons.   1/2 cup white flour plus 1/2 cup whole-wheat flour.   Polyps, Colon  A polyp is extra tissue that grows inside your body. Colon polyps grow in the large intestine. The large intestine, also called the colon,  is part of your digestive system. It is a long, hollow tube at the end of your digestive tract where your body makes and stores stool. Most polyps are not dangerous. They are benign. This means they are not cancerous. But over time, some types of polyps can turn into cancer. Polyps that are smaller than a pea are usually not harmful. But larger polyps could someday become or may already be cancerous. To be safe, doctors remove all polyps and test them.   WHO GETS POLYPS? Anyone can get polyps, but certain people are more likely than others. You may have a greater chance of getting polyps if:  You are over 50.   You have had polyps before.   Someone in your family has had polyps.   Someone in your family has had cancer of the large intestine.   Find out if someone in your family has had polyps. You may also be more likely to get polyps if you:   Eat a lot of fatty foods  Smoke   Drink alcohol   Do not exercise  Eat too much   PREVENTION There is not one sure way to prevent polyps. You might be able to lower your risk of getting them if you:  Eat more fruits and vegetables and less fatty food.   Do not smoke.   Avoid alcohol.   Exercise every day.   Lose weight if you are overweight.   Eating more calcium and folate can also lower your risk of getting polyps. Some foods that are rich in calcium are milk, cheese, and broccoli. Some foods that are rich in folate are chickpeas, kidney beans, and spinach.

## 2018-01-31 NOTE — Progress Notes (Signed)
PT is aware.

## 2018-02-02 ENCOUNTER — Encounter (HOSPITAL_COMMUNITY): Payer: Self-pay | Admitting: Gastroenterology

## 2018-02-07 ENCOUNTER — Ambulatory Visit (HOSPITAL_COMMUNITY)
Admission: RE | Admit: 2018-02-07 | Discharge: 2018-02-07 | Disposition: A | Discharge status: Home / Self Care | Payer: Medicare Other | Source: Ambulatory Visit | Attending: Family Medicine | Admitting: Family Medicine

## 2018-02-07 DIAGNOSIS — Z1231 Encounter for screening mammogram for malignant neoplasm of breast: Secondary | ICD-10-CM | POA: Diagnosis not present

## 2018-04-11 DIAGNOSIS — N390 Urinary tract infection, site not specified: Secondary | ICD-10-CM | POA: Diagnosis not present

## 2018-04-11 DIAGNOSIS — I10 Essential (primary) hypertension: Secondary | ICD-10-CM | POA: Diagnosis not present

## 2018-04-11 DIAGNOSIS — Z6824 Body mass index (BMI) 24.0-24.9, adult: Secondary | ICD-10-CM | POA: Diagnosis not present

## 2018-07-11 DIAGNOSIS — E782 Mixed hyperlipidemia: Secondary | ICD-10-CM | POA: Diagnosis not present

## 2018-07-11 DIAGNOSIS — Z6824 Body mass index (BMI) 24.0-24.9, adult: Secondary | ICD-10-CM | POA: Diagnosis not present

## 2018-07-11 DIAGNOSIS — Z7189 Other specified counseling: Secondary | ICD-10-CM | POA: Diagnosis not present

## 2018-07-11 DIAGNOSIS — I10 Essential (primary) hypertension: Secondary | ICD-10-CM | POA: Diagnosis not present

## 2018-08-30 DIAGNOSIS — H2513 Age-related nuclear cataract, bilateral: Secondary | ICD-10-CM | POA: Diagnosis not present

## 2018-08-30 DIAGNOSIS — H04123 Dry eye syndrome of bilateral lacrimal glands: Secondary | ICD-10-CM | POA: Diagnosis not present

## 2018-09-21 ENCOUNTER — Other Ambulatory Visit: Payer: Self-pay

## 2018-11-15 DIAGNOSIS — N390 Urinary tract infection, site not specified: Secondary | ICD-10-CM | POA: Diagnosis not present

## 2018-11-15 DIAGNOSIS — I10 Essential (primary) hypertension: Secondary | ICD-10-CM | POA: Diagnosis not present

## 2018-11-15 DIAGNOSIS — E785 Hyperlipidemia, unspecified: Secondary | ICD-10-CM | POA: Diagnosis not present

## 2018-11-16 DIAGNOSIS — E785 Hyperlipidemia, unspecified: Secondary | ICD-10-CM | POA: Diagnosis not present

## 2018-11-16 DIAGNOSIS — I10 Essential (primary) hypertension: Secondary | ICD-10-CM | POA: Diagnosis not present

## 2018-11-16 DIAGNOSIS — E789 Disorder of lipoprotein metabolism, unspecified: Secondary | ICD-10-CM | POA: Diagnosis not present

## 2018-11-16 DIAGNOSIS — N390 Urinary tract infection, site not specified: Secondary | ICD-10-CM | POA: Diagnosis not present

## 2019-03-16 DIAGNOSIS — Z23 Encounter for immunization: Secondary | ICD-10-CM | POA: Diagnosis not present

## 2019-03-23 ENCOUNTER — Other Ambulatory Visit (HOSPITAL_COMMUNITY): Payer: Self-pay | Admitting: Family Medicine

## 2019-03-23 DIAGNOSIS — Z1231 Encounter for screening mammogram for malignant neoplasm of breast: Secondary | ICD-10-CM

## 2019-03-30 ENCOUNTER — Ambulatory Visit (HOSPITAL_COMMUNITY)
Admission: RE | Admit: 2019-03-30 | Discharge: 2019-03-30 | Disposition: A | Discharge status: Home / Self Care | Payer: Medicare Other | Source: Ambulatory Visit | Attending: Family Medicine | Admitting: Family Medicine

## 2019-03-30 ENCOUNTER — Other Ambulatory Visit: Payer: Self-pay

## 2019-03-30 DIAGNOSIS — Z1231 Encounter for screening mammogram for malignant neoplasm of breast: Secondary | ICD-10-CM

## 2019-04-17 DIAGNOSIS — E782 Mixed hyperlipidemia: Secondary | ICD-10-CM | POA: Diagnosis not present

## 2019-04-17 DIAGNOSIS — I10 Essential (primary) hypertension: Secondary | ICD-10-CM | POA: Diagnosis not present

## 2019-04-21 DIAGNOSIS — Z23 Encounter for immunization: Secondary | ICD-10-CM | POA: Diagnosis not present

## 2019-06-08 DIAGNOSIS — H04123 Dry eye syndrome of bilateral lacrimal glands: Secondary | ICD-10-CM | POA: Diagnosis not present

## 2019-06-08 DIAGNOSIS — H2513 Age-related nuclear cataract, bilateral: Secondary | ICD-10-CM | POA: Diagnosis not present

## 2019-06-12 DIAGNOSIS — H04123 Dry eye syndrome of bilateral lacrimal glands: Secondary | ICD-10-CM | POA: Diagnosis not present

## 2019-06-12 DIAGNOSIS — H2513 Age-related nuclear cataract, bilateral: Secondary | ICD-10-CM | POA: Diagnosis not present

## 2019-07-06 DIAGNOSIS — H2511 Age-related nuclear cataract, right eye: Secondary | ICD-10-CM | POA: Diagnosis not present

## 2019-07-30 DIAGNOSIS — H2512 Age-related nuclear cataract, left eye: Secondary | ICD-10-CM | POA: Diagnosis not present

## 2019-08-03 DIAGNOSIS — H2512 Age-related nuclear cataract, left eye: Secondary | ICD-10-CM | POA: Diagnosis not present

## 2019-08-14 DIAGNOSIS — I10 Essential (primary) hypertension: Secondary | ICD-10-CM | POA: Diagnosis not present

## 2019-08-14 DIAGNOSIS — Z7189 Other specified counseling: Secondary | ICD-10-CM | POA: Diagnosis not present

## 2019-08-14 DIAGNOSIS — M542 Cervicalgia: Secondary | ICD-10-CM | POA: Diagnosis not present

## 2019-08-14 DIAGNOSIS — E782 Mixed hyperlipidemia: Secondary | ICD-10-CM | POA: Diagnosis not present

## 2019-09-22 DIAGNOSIS — E7849 Other hyperlipidemia: Secondary | ICD-10-CM | POA: Diagnosis not present

## 2019-09-22 DIAGNOSIS — I10 Essential (primary) hypertension: Secondary | ICD-10-CM | POA: Diagnosis not present

## 2019-11-14 DIAGNOSIS — E782 Mixed hyperlipidemia: Secondary | ICD-10-CM | POA: Diagnosis not present

## 2019-11-14 DIAGNOSIS — E785 Hyperlipidemia, unspecified: Secondary | ICD-10-CM | POA: Diagnosis not present

## 2019-11-14 DIAGNOSIS — Z6823 Body mass index (BMI) 23.0-23.9, adult: Secondary | ICD-10-CM | POA: Diagnosis not present

## 2019-11-14 DIAGNOSIS — I10 Essential (primary) hypertension: Secondary | ICD-10-CM | POA: Diagnosis not present

## 2019-12-28 DIAGNOSIS — Z23 Encounter for immunization: Secondary | ICD-10-CM | POA: Diagnosis not present

## 2020-01-23 DIAGNOSIS — I10 Essential (primary) hypertension: Secondary | ICD-10-CM | POA: Diagnosis not present

## 2020-01-23 DIAGNOSIS — E7849 Other hyperlipidemia: Secondary | ICD-10-CM | POA: Diagnosis not present

## 2020-03-19 DIAGNOSIS — I10 Essential (primary) hypertension: Secondary | ICD-10-CM | POA: Diagnosis not present

## 2020-03-19 DIAGNOSIS — E785 Hyperlipidemia, unspecified: Secondary | ICD-10-CM | POA: Diagnosis not present

## 2020-03-23 DIAGNOSIS — I10 Essential (primary) hypertension: Secondary | ICD-10-CM | POA: Diagnosis not present

## 2020-03-23 DIAGNOSIS — E7849 Other hyperlipidemia: Secondary | ICD-10-CM | POA: Diagnosis not present

## 2020-03-25 ENCOUNTER — Other Ambulatory Visit (HOSPITAL_COMMUNITY): Payer: Self-pay | Admitting: Family Medicine

## 2020-03-25 DIAGNOSIS — Z1231 Encounter for screening mammogram for malignant neoplasm of breast: Secondary | ICD-10-CM

## 2020-04-08 ENCOUNTER — Ambulatory Visit (HOSPITAL_COMMUNITY): Payer: Medicare Other

## 2020-04-12 ENCOUNTER — Ambulatory Visit (HOSPITAL_COMMUNITY)
Admission: RE | Admit: 2020-04-12 | Discharge: 2020-04-12 | Disposition: A | Discharge status: Home / Self Care | Payer: Medicare Other | Source: Ambulatory Visit | Attending: Family Medicine | Admitting: Family Medicine

## 2020-04-12 ENCOUNTER — Other Ambulatory Visit: Payer: Self-pay

## 2020-04-12 DIAGNOSIS — Z1231 Encounter for screening mammogram for malignant neoplasm of breast: Secondary | ICD-10-CM | POA: Diagnosis not present

## 2020-04-16 DIAGNOSIS — I10 Essential (primary) hypertension: Secondary | ICD-10-CM | POA: Diagnosis not present

## 2020-04-16 DIAGNOSIS — R7401 Elevation of levels of liver transaminase levels: Secondary | ICD-10-CM | POA: Diagnosis not present

## 2020-04-19 ENCOUNTER — Other Ambulatory Visit (HOSPITAL_COMMUNITY): Payer: Self-pay | Admitting: Family Medicine

## 2020-04-19 DIAGNOSIS — R945 Abnormal results of liver function studies: Secondary | ICD-10-CM

## 2020-04-19 DIAGNOSIS — R7989 Other specified abnormal findings of blood chemistry: Secondary | ICD-10-CM

## 2020-04-19 DIAGNOSIS — R7401 Elevation of levels of liver transaminase levels: Secondary | ICD-10-CM

## 2020-04-22 DIAGNOSIS — R7401 Elevation of levels of liver transaminase levels: Secondary | ICD-10-CM | POA: Diagnosis not present

## 2020-05-01 DIAGNOSIS — R7401 Elevation of levels of liver transaminase levels: Secondary | ICD-10-CM | POA: Diagnosis not present

## 2020-05-07 DIAGNOSIS — K752 Nonspecific reactive hepatitis: Secondary | ICD-10-CM | POA: Diagnosis not present

## 2020-05-07 DIAGNOSIS — R748 Abnormal levels of other serum enzymes: Secondary | ICD-10-CM | POA: Diagnosis not present

## 2020-05-07 DIAGNOSIS — K712 Toxic liver disease with acute hepatitis: Secondary | ICD-10-CM | POA: Diagnosis not present

## 2020-05-13 DIAGNOSIS — R7401 Elevation of levels of liver transaminase levels: Secondary | ICD-10-CM | POA: Diagnosis not present

## 2020-05-14 ENCOUNTER — Ambulatory Visit (HOSPITAL_COMMUNITY)
Admission: RE | Admit: 2020-05-14 | Discharge: 2020-05-14 | Disposition: A | Discharge status: Home / Self Care | Payer: Medicare Other | Source: Ambulatory Visit | Attending: Family Medicine | Admitting: Family Medicine

## 2020-05-14 ENCOUNTER — Other Ambulatory Visit: Payer: Self-pay

## 2020-05-14 ENCOUNTER — Encounter (HOSPITAL_COMMUNITY): Payer: Self-pay | Admitting: Radiology

## 2020-05-14 DIAGNOSIS — R590 Localized enlarged lymph nodes: Secondary | ICD-10-CM | POA: Diagnosis not present

## 2020-05-14 DIAGNOSIS — R7401 Elevation of levels of liver transaminase levels: Secondary | ICD-10-CM

## 2020-05-14 DIAGNOSIS — R112 Nausea with vomiting, unspecified: Secondary | ICD-10-CM | POA: Diagnosis not present

## 2020-05-14 DIAGNOSIS — R945 Abnormal results of liver function studies: Secondary | ICD-10-CM | POA: Diagnosis not present

## 2020-05-14 DIAGNOSIS — R7989 Other specified abnormal findings of blood chemistry: Secondary | ICD-10-CM | POA: Diagnosis not present

## 2020-05-14 DIAGNOSIS — R197 Diarrhea, unspecified: Secondary | ICD-10-CM | POA: Diagnosis not present

## 2020-05-14 LAB — POCT I-STAT CREATININE: Creatinine, Ser: 0.7 mg/dL (ref 0.44–1.00)

## 2020-05-14 MED ORDER — IOHEXOL 300 MG/ML  SOLN
80.0000 mL | Freq: Once | INTRAMUSCULAR | Status: AC | PRN
  Administered 2020-05-14: 80 mL via INTRAVENOUS

## 2020-05-20 ENCOUNTER — Other Ambulatory Visit (HOSPITAL_COMMUNITY): Payer: Self-pay | Admitting: Nurse Practitioner

## 2020-05-20 DIAGNOSIS — R748 Abnormal levels of other serum enzymes: Secondary | ICD-10-CM

## 2020-05-27 ENCOUNTER — Other Ambulatory Visit: Payer: Self-pay | Admitting: Radiology

## 2020-05-27 DIAGNOSIS — R7401 Elevation of levels of liver transaminase levels: Secondary | ICD-10-CM | POA: Diagnosis not present

## 2020-05-28 ENCOUNTER — Other Ambulatory Visit: Payer: Self-pay | Admitting: Student

## 2020-05-29 ENCOUNTER — Ambulatory Visit (HOSPITAL_COMMUNITY)
Admission: RE | Admit: 2020-05-29 | Discharge: 2020-05-29 | Disposition: A | Discharge status: Home / Self Care | Payer: Medicare Other | Source: Ambulatory Visit | Attending: Nurse Practitioner | Admitting: Nurse Practitioner

## 2020-05-29 ENCOUNTER — Other Ambulatory Visit: Payer: Self-pay

## 2020-05-29 DIAGNOSIS — I10 Essential (primary) hypertension: Secondary | ICD-10-CM | POA: Diagnosis not present

## 2020-05-29 DIAGNOSIS — K754 Autoimmune hepatitis: Secondary | ICD-10-CM | POA: Diagnosis not present

## 2020-05-29 DIAGNOSIS — E785 Hyperlipidemia, unspecified: Secondary | ICD-10-CM | POA: Insufficient documentation

## 2020-05-29 DIAGNOSIS — R945 Abnormal results of liver function studies: Secondary | ICD-10-CM | POA: Diagnosis not present

## 2020-05-29 DIAGNOSIS — B179 Acute viral hepatitis, unspecified: Secondary | ICD-10-CM | POA: Insufficient documentation

## 2020-05-29 DIAGNOSIS — R748 Abnormal levels of other serum enzymes: Secondary | ICD-10-CM | POA: Diagnosis not present

## 2020-05-29 LAB — CBC
HCT: 38.8 % (ref 36.0–46.0)
Hemoglobin: 12.3 g/dL (ref 12.0–15.0)
MCH: 25.3 pg — ABNORMAL LOW (ref 26.0–34.0)
MCHC: 31.7 g/dL (ref 30.0–36.0)
MCV: 79.7 fL — ABNORMAL LOW (ref 80.0–100.0)
Platelets: 272 10*3/uL (ref 150–400)
RBC: 4.87 MIL/uL (ref 3.87–5.11)
RDW: 17.8 % — ABNORMAL HIGH (ref 11.5–15.5)
WBC: 4.6 10*3/uL (ref 4.0–10.5)
nRBC: 0 % (ref 0.0–0.2)

## 2020-05-29 LAB — PROTIME-INR
INR: 1 (ref 0.8–1.2)
Prothrombin Time: 12.5 seconds (ref 11.4–15.2)

## 2020-05-29 MED ORDER — MIDAZOLAM HCL 2 MG/2ML IJ SOLN
INTRAMUSCULAR | Status: AC
  Filled 2020-05-29: qty 2

## 2020-05-29 MED ORDER — LIDOCAINE HCL (PF) 1 % IJ SOLN
INTRAMUSCULAR | Status: AC
  Filled 2020-05-29: qty 30

## 2020-05-29 MED ORDER — FENTANYL CITRATE (PF) 100 MCG/2ML IJ SOLN
INTRAMUSCULAR | Status: AC | PRN
  Administered 2020-05-29: 25 ug via INTRAVENOUS

## 2020-05-29 MED ORDER — LIDOCAINE-EPINEPHRINE 1 %-1:100000 IJ SOLN
INTRAMUSCULAR | Status: AC
  Filled 2020-05-29: qty 1

## 2020-05-29 MED ORDER — SODIUM CHLORIDE 0.9 % IV SOLN: INTRAVENOUS | Status: DC

## 2020-05-29 MED ORDER — GELATIN ABSORBABLE 12-7 MM EX MISC
CUTANEOUS | Status: AC
  Filled 2020-05-29: qty 1

## 2020-05-29 MED ORDER — FENTANYL CITRATE (PF) 100 MCG/2ML IJ SOLN
INTRAMUSCULAR | Status: AC
  Filled 2020-05-29: qty 2

## 2020-05-29 MED ORDER — MIDAZOLAM HCL 2 MG/2ML IJ SOLN
INTRAMUSCULAR | Status: AC | PRN
  Administered 2020-05-29: 1 mg via INTRAVENOUS

## 2020-05-29 NOTE — Consult Note (Signed)
Chief Complaint: Patient was seen in consultation today for image guided random core liver biopsy  Referring Physician(s): Drazek,Dawn,NP  Supervising Physician: Sandi Mariscal  Patient Status: [name] - Out-pt  History of Present Illness: [name] is a 75 y.o. female with past medical history of hypertension, hyperlipidemia and now with elevated liver function tests, elevated IgG level, weakly positive ASMA and negative ANA/AMA.  She presents today for image guided random core liver biopsy to rule out autoimmune hepatitis.  Past Medical History:  Diagnosis Date  . High cholesterol   . Hypertension     Past Surgical History:  Procedure Laterality Date  . COLONOSCOPY N/A 01/28/2018   Procedure: COLONOSCOPY;  Surgeon: Danie Binder, MD;  Location: [facility] SU[facility];  Service: Endoscopy;  Laterality: N/A;  8:30  . LAPAROSCOPIC HYSTERECTOMY    . POLYPECTOMY  01/28/2018   Procedure: POLYPECTOMY;  Surgeon: Danie Binder, MD;  Location: [facility] SUITE;  Service: Endoscopy;;  colon    Allergies: Patient has no known allergies.  Medications: Prior to Admission medications   Medication Sig Start Date End Date Taking? Authorizing Provider  amLODipine (NORVASC) 5 MG tablet Take 5 mg by mouth daily.   Yes [provider]  aspirin EC 81 MG tablet Take 81 mg by mouth at bedtime.    Yes [provider]  hydrochlorothiazide (MICROZIDE) 12.5 MG capsule Take 12.5 mg by mouth at bedtime.  08/06/17  Yes [provider]  irbesartan (AVAPRO) 150 MG tablet Take 150 mg by mouth at bedtime.  08/06/17  Yes [provider]  Multiple Vitamins-Minerals (ADULT GUMMY PO) Take 2 tablets by mouth daily.   Yes [provider]  rosuvastatin (CRESTOR) 5 MG tablet Take 5 mg by mouth at bedtime.  08/19/17  Yes [provider]     Family History  Problem Relation Age of Onset  . Colon cancer Neg Hx   . Colon polyps Neg Hx     Social History    Socioeconomic History  . Marital status: Married    Spouse name: Not on file  . Number of children: Not on file  . Years of education: Not on file  . Highest education level: Not on file  Occupational History  . Not on file  Tobacco Use  . Smoking status: Never Smoker  . Smokeless tobacco: Never Used  Substance and Sexual Activity  . Alcohol use: No  . Drug use: No  . Sexual activity: Yes    Birth control/protection: Surgical  Other Topics Concern  . Not on file  Social History Narrative   MARRIED. 2 SONS-5 GRANDKIDS. RETIRED FROM [employer].   Social Determinants of Health   Financial Resource Strain: Not on file  Food Insecurity: Not on file  Transportation Needs: Not on file  Physical Activity: Not on file  Stress: Not on file  Social Connections: Not on file      Review of Systems currently denies fever, headache, chest pain, dyspnea, cough, abdominal/back pain, nausea, vomiting or bleeding  Vital Signs: BP 139/69   Pulse 72   Temp 97.6 F (36.4 C) (Oral)   Ht 5\' 1"  (1.549 m)   Wt 120 lb (54.4 kg)   SpO2 96%   BMI 22.67 kg/m   Physical Exam awake, alert.  Chest clear to auscultation bilaterally.  Heart with regular rate and rhythm.  Abdomen soft, positive bowel sounds, nontender.  No lower extremity edema.  Imaging: CT ABDOMEN W WO CONTRAST  Result Date:  05/15/2020 CLINICAL DATA:  Elevated liver function tests. Nausea, vomiting and diarrhea. EXAM: CT ABDOMEN WITHOUT AND WITH CONTRAST TECHNIQUE: Multidetector CT imaging of the abdomen was performed following the standard protocol before and following the bolus administration of intravenous contrast. CONTRAST:  65mL OMNIPAQUE IOHEXOL 300 MG/ML  SOLN COMPARISON:  01/29/2007. FINDINGS: Lower chest: Lung bases are clear. Heart size normal. No pericardial or pleural effusion. Distal esophagus is unremarkable. Hepatobiliary: 8 mm focal arterial phase enhancement in the right hepatic lobe (6/17) appears less intense  on portal venous phase imaging. Similar blush of hyperattenuation in the right hepatic lobe on 01/29/2007, findings indicative of a flash fill hemangioma. No additional areas of abnormal arterial phase enhancement. Liver and gallbladder are unremarkable. No biliary ductal dilatation. Pancreas: Negative. Spleen: Negative. Adrenals/Urinary Tract: Adrenal glands are unremarkable. Subcentimeter low-attenuation lesions in the kidneys are too small to characterize but statistically, cysts are likely. No urinary stones. Stomach/Bowel: Stomach and visualized portions of the small bowel, appendix and colon are unremarkable. Vascular/Lymphatic: Vascular structures are unremarkable. Scattered lymph nodes are not enlarged by CT size criteria. Other: No free fluid.  Mesenteries and peritoneum are unremarkable. Musculoskeletal: None. IMPRESSION: No findings to explain the patient's clinical history. Electronically Signed   By: Lorin Picket M.D.   On: 05/15/2020 11:45    Labs:  CBC: No results for input(s): WBC, HGB, HCT, PLT in the last 8760 hours.  COAGS: No results for input(s): INR, APTT in the last 8760 hours.  BMP: Recent Labs    05/14/20 1414  CREATININE 0.70    LIVER FUNCTION TESTS: No results for input(s): BILITOT, AST, ALT, ALKPHOS, PROT, ALBUMIN in the last 8760 hours.  TUMOR MARKERS: No results for input(s): AFPTM, CEA, CA199, CHROMGRNA in the last 8760 hours.  Assessment and Plan: 75 y.o. female with past medical history of hypertension, hyperlipidemia and now with elevated liver function tests, elevated IgG level, weakly positive ASMA and negative ANA/AMA.  She presents today for image guided random core liver biopsy to rule out autoimmune hepatitis.Risks and benefits of procedure was discussed with the patient  including, but not limited to bleeding, infection, damage to adjacent structures or low yield requiring additional tests.  All of the questions were answered and there is  agreement to proceed.  Consent signed and in chart.  LABS PENDING   Thank you for this interesting consult.  I greatly enjoyed meeting [name] and look forward to participating in their care.  A copy of this report was sent to the requesting provider on this date.  Electronically Signed: D. Rowe Robert, PA-C 05/29/2020, 7:30 AM   I spent a total of  25 minutes in face to face in clinical consultation, greater than 50% of which was counseling/coordinating care for image guided random core liver biopsy

## 2020-05-29 NOTE — Procedures (Signed)
Pre Procedure Dx: Elevated LFTs °Post Procedural Dx: Same ° °Technically successful US guided biopsy of right lobe of the liver. ° °EBL: Trace ° °No immediate complications.  ° °Jay Norvella Loscalzo, MD °Pager #: [phone] ° ° °

## 2020-05-29 NOTE — Discharge Instructions (Signed)
Liver Biopsy, Care After These instructions give you information on caring for yourself after your procedure. Your doctor may also give you more specific instructions. Call your doctor if you have any problems or questions after your procedure. What can I expect after the procedure? After the procedure, it is common to have:  Pain and soreness where the biopsy was done.  Bruising around the area where the biopsy was done.  Sleepiness and be tired for a few days. Follow these instructions at home: Medicines  Take over-the-counter and prescription medicines only as told by your doctor.  If you were prescribed an antibiotic medicine, take it as told by your doctor. Do not stop taking the antibiotic even if you start to feel better.  Do not take medicines such as aspirin and ibuprofen. These medicines can thin your blood. Do not take these medicines unless your doctor tells you to take them.  If you are taking prescription pain medicine, take actions to prevent or treat constipation. Your doctor may recommend that you: ? Drink enough fluid to keep your pee (urine) clear or pale yellow. ? Take over-the-counter or prescription medicines. ? Eat foods that are high in fiber, such as fresh fruits and vegetables, whole grains, and beans. ? Limit foods that are high in fat and processed sugars, such as fried and sweet foods. Caring for your cut  Follow instructions from your doctor about how to take care of your cuts from surgery (incisions). Make sure you: ? Wash your hands with soap and water before you change your bandage (dressing). If you cannot use soap and water, use hand sanitizer. ? Change your bandage as told by your doctor. ? Leave stitches (sutures), skin glue, or skin tape (adhesive) strips in place. They may need to stay in place for 2 weeks or longer. If tape strips get loose and curl up, you may trim the loose edges. Do not remove tape strips completely unless your doctor says it is  okay.  Check your cuts every day for signs of infection. Check for: ? Redness, swelling, or more pain. ? Fluid or blood. ? Pus or a bad smell. ? Warmth.  Do not take baths, swim, or use a hot tub until your doctor says it is okay to do so. Activity  Rest at home for 1-2 days or as told by your doctor. ? Avoid sitting for a long time without moving. Get up to take short walks every 1-2 hours.  Return to your normal activities as told by your doctor. Ask what activities are safe for you.  Do not do these things in the first 24 hours: ? Drive. ? Use machinery. ? Take a bath or shower.  Do not lift more than 10 pounds (4.5 kg) or play contact sports for the first 2 weeks.   General instructions  Do not drink alcohol in the first week after the procedure.  Have someone stay with you for at least 24 hours after the procedure.  Get your test results. Ask your doctor or the department that is doing the test: ? When will my results be ready? ? How will I get my results? ? What are my treatment options? ? What other tests do I need? ? What are my next steps?  Keep all follow-up visits as told by your doctor. This is important.   Contact a doctor if:  A cut bleeds and leaves more than just a small spot of blood.  A cut is red,   puffs up (swells), or hurts more than before.  Fluid or something else comes from a cut.  A cut smells bad.  You have a fever or chills. Get help right away if:  You have swelling, bloating, or pain in your belly (abdomen).  You get dizzy or faint.  You have a rash.  You feel sick to your stomach (nauseous) or throw up (vomit).  You have trouble breathing, feel short of breath, or feel faint.  Your chest hurts.  You have problems talking or seeing.  You have trouble with your balance or moving your arms or legs. Summary  After the procedure, it is common to have pain, soreness, bruising, and tiredness.  Your doctor will tell you how to  take care of yourself at home. Change your bandage, take your medicines, and limit your activities as told by your doctor.  Call your doctor if you have symptoms of infection. Get help right away if your belly swells, your cut bleeds a lot, or you have trouble talking or breathing. This information is not intended to replace advice given to you by your health care provider. Make sure you discuss any questions you have with your health care provider. Document Revised: 02/18/2017 Document Reviewed: 02/19/2017 Elsevier Patient Education  2021 Elsevier Inc.  

## 2020-05-30 LAB — SURGICAL PATHOLOGY

## 2020-06-17 DIAGNOSIS — Z79899 Other long term (current) drug therapy: Secondary | ICD-10-CM | POA: Diagnosis not present

## 2020-06-17 DIAGNOSIS — D84821 Immunodeficiency due to drugs: Secondary | ICD-10-CM | POA: Diagnosis not present

## 2020-06-17 DIAGNOSIS — K754 Autoimmune hepatitis: Secondary | ICD-10-CM | POA: Diagnosis not present

## 2020-06-22 DIAGNOSIS — E7849 Other hyperlipidemia: Secondary | ICD-10-CM | POA: Diagnosis not present

## 2020-06-22 DIAGNOSIS — I10 Essential (primary) hypertension: Secondary | ICD-10-CM | POA: Diagnosis not present

## 2020-06-25 DIAGNOSIS — K754 Autoimmune hepatitis: Secondary | ICD-10-CM | POA: Diagnosis not present

## 2020-06-25 DIAGNOSIS — R970 Elevated carcinoembryonic antigen [CEA]: Secondary | ICD-10-CM | POA: Diagnosis not present

## 2020-06-25 DIAGNOSIS — I10 Essential (primary) hypertension: Secondary | ICD-10-CM | POA: Diagnosis not present

## 2020-06-25 DIAGNOSIS — R7401 Elevation of levels of liver transaminase levels: Secondary | ICD-10-CM | POA: Diagnosis not present

## 2020-06-25 DIAGNOSIS — D84821 Immunodeficiency due to drugs: Secondary | ICD-10-CM | POA: Diagnosis not present

## 2020-06-25 DIAGNOSIS — Z79899 Other long term (current) drug therapy: Secondary | ICD-10-CM | POA: Diagnosis not present

## 2020-07-01 DIAGNOSIS — K754 Autoimmune hepatitis: Secondary | ICD-10-CM | POA: Diagnosis not present

## 2020-07-15 DIAGNOSIS — K754 Autoimmune hepatitis: Secondary | ICD-10-CM | POA: Diagnosis not present

## 2020-07-23 DIAGNOSIS — I10 Essential (primary) hypertension: Secondary | ICD-10-CM | POA: Diagnosis not present

## 2020-07-23 DIAGNOSIS — E7849 Other hyperlipidemia: Secondary | ICD-10-CM | POA: Diagnosis not present

## 2020-07-26 DIAGNOSIS — K754 Autoimmune hepatitis: Secondary | ICD-10-CM | POA: Diagnosis not present

## 2020-07-29 DIAGNOSIS — Z79899 Other long term (current) drug therapy: Secondary | ICD-10-CM | POA: Diagnosis not present

## 2020-07-29 DIAGNOSIS — R600 Localized edema: Secondary | ICD-10-CM | POA: Diagnosis not present

## 2020-07-29 DIAGNOSIS — K754 Autoimmune hepatitis: Secondary | ICD-10-CM | POA: Diagnosis not present

## 2020-07-29 DIAGNOSIS — D84821 Immunodeficiency due to drugs: Secondary | ICD-10-CM | POA: Diagnosis not present

## 2020-08-09 DIAGNOSIS — K754 Autoimmune hepatitis: Secondary | ICD-10-CM | POA: Diagnosis not present

## 2020-08-19 DIAGNOSIS — E782 Mixed hyperlipidemia: Secondary | ICD-10-CM | POA: Diagnosis not present

## 2020-08-19 DIAGNOSIS — I10 Essential (primary) hypertension: Secondary | ICD-10-CM | POA: Diagnosis not present

## 2020-08-19 DIAGNOSIS — R7401 Elevation of levels of liver transaminase levels: Secondary | ICD-10-CM | POA: Diagnosis not present

## 2020-08-19 DIAGNOSIS — D84821 Immunodeficiency due to drugs: Secondary | ICD-10-CM | POA: Diagnosis not present

## 2020-08-22 DIAGNOSIS — I10 Essential (primary) hypertension: Secondary | ICD-10-CM | POA: Diagnosis not present

## 2020-08-22 DIAGNOSIS — E7849 Other hyperlipidemia: Secondary | ICD-10-CM | POA: Diagnosis not present

## 2020-08-23 DIAGNOSIS — K754 Autoimmune hepatitis: Secondary | ICD-10-CM | POA: Diagnosis not present

## 2020-09-02 DIAGNOSIS — R7301 Impaired fasting glucose: Secondary | ICD-10-CM | POA: Diagnosis not present

## 2020-09-02 DIAGNOSIS — R112 Nausea with vomiting, unspecified: Secondary | ICD-10-CM | POA: Diagnosis not present

## 2020-09-02 DIAGNOSIS — D84821 Immunodeficiency due to drugs: Secondary | ICD-10-CM | POA: Diagnosis not present

## 2020-09-02 DIAGNOSIS — I10 Essential (primary) hypertension: Secondary | ICD-10-CM | POA: Diagnosis not present

## 2020-09-02 DIAGNOSIS — L299 Pruritus, unspecified: Secondary | ICD-10-CM | POA: Diagnosis not present

## 2020-09-02 DIAGNOSIS — R7401 Elevation of levels of liver transaminase levels: Secondary | ICD-10-CM | POA: Diagnosis not present

## 2020-09-06 DIAGNOSIS — K754 Autoimmune hepatitis: Secondary | ICD-10-CM | POA: Diagnosis not present

## 2020-09-16 ENCOUNTER — Other Ambulatory Visit (HOSPITAL_COMMUNITY): Payer: Self-pay | Admitting: Family Medicine

## 2020-09-16 DIAGNOSIS — G2401 Drug induced subacute dyskinesia: Secondary | ICD-10-CM

## 2020-09-22 DIAGNOSIS — I10 Essential (primary) hypertension: Secondary | ICD-10-CM | POA: Diagnosis not present

## 2020-09-22 DIAGNOSIS — E7849 Other hyperlipidemia: Secondary | ICD-10-CM | POA: Diagnosis not present

## 2020-09-24 ENCOUNTER — Encounter (HOSPITAL_COMMUNITY): Payer: Self-pay

## 2020-09-24 ENCOUNTER — Encounter (HOSPITAL_COMMUNITY)
Admission: RE | Admit: 2020-09-24 | Discharge: 2020-09-24 | Disposition: A | Discharge status: Home / Self Care | Payer: Medicare Other | Source: Ambulatory Visit | Attending: Family Medicine | Admitting: Family Medicine

## 2020-09-24 ENCOUNTER — Other Ambulatory Visit: Payer: Self-pay

## 2020-09-24 DIAGNOSIS — R109 Unspecified abdominal pain: Secondary | ICD-10-CM | POA: Diagnosis not present

## 2020-09-24 DIAGNOSIS — R7989 Other specified abnormal findings of blood chemistry: Secondary | ICD-10-CM | POA: Diagnosis not present

## 2020-09-24 DIAGNOSIS — R945 Abnormal results of liver function studies: Secondary | ICD-10-CM | POA: Diagnosis not present

## 2020-09-24 DIAGNOSIS — G2401 Drug induced subacute dyskinesia: Secondary | ICD-10-CM | POA: Insufficient documentation

## 2020-09-24 MED ORDER — TECHNETIUM TC 99M MEBROFENIN IV KIT
5.0000 | PACK | Freq: Once | INTRAVENOUS | Status: AC | PRN
  Administered 2020-09-24: 4.6 via INTRAVENOUS

## 2020-09-25 ENCOUNTER — Ambulatory Visit (INDEPENDENT_AMBULATORY_CARE_PROVIDER_SITE_OTHER): Payer: Medicare Other | Admitting: Gastroenterology

## 2020-09-25 ENCOUNTER — Encounter: Payer: Self-pay | Admitting: Gastroenterology

## 2020-09-25 DIAGNOSIS — R1010 Upper abdominal pain, unspecified: Secondary | ICD-10-CM | POA: Diagnosis not present

## 2020-09-25 DIAGNOSIS — R112 Nausea with vomiting, unspecified: Secondary | ICD-10-CM

## 2020-09-25 DIAGNOSIS — R109 Unspecified abdominal pain: Secondary | ICD-10-CM | POA: Insufficient documentation

## 2020-09-25 MED ORDER — PANTOPRAZOLE SODIUM 40 MG PO TBEC: 40.0000 mg | DELAYED_RELEASE_TABLET | Freq: Every day | ORAL | 3 refills | Status: DC

## 2020-09-25 NOTE — Progress Notes (Signed)
Primary Care Physician: Iona Beard, MD  Primary Gastroenterologist:  Elon Alas. Abbey Chatters, DO   Chief Complaint  Patient presents with   Abdominal Pain    Vomiting 7/4, recently stopped taking Azathioprine and Prednisone and vomiting and abd pain stopped, was taking for elevated liver enzymes    HPI: [name] is a 75 y.o. female here at the request of Dr. Berdine Addison for further evaluation of abdominal pain, vomiting.  Patient diagnosed with biopsy-proven, ASMA positive autoimmune hepatitis earlier this year.  She is being followed by [facility], Roosevelt Locks, NP.  She was started on prednisone and azathioprine, April 2022.  Patient states she seemed to tolerate medications at first.  She recalls at some point her azathioprine was increased from 50 mg to 75 mg but she is not sure when.  On July 4 out of the blue, vomiting started.  Symptoms were intermittent at first.  Associated with upper abdominal pain.  At times she did notice that occurred after taking the medication.  No heartburn.  Denies weight loss.  Complains of weight gain, lower extremity edema.  No bowel concerns.  She tried decreasing prednisone and azathioprine to half dose but still had symptoms.  Stopped completely 2 weeks ago.  She has been started on budesonide as a replacement.    Patient states for the past week or so she has had absolutely no symptoms.  She had a HIDA scan yesterday with no reproduction of symptoms.  Gallbladder ejection fraction was 22% (normal gallbladder ejection fraction with Ensure should be greater than 33%).     CT abdomen with and without contrast April 2022 for elevated LFTs, vomiting and diarrhea.  8 mm focal arterial phase enhancement in the right hepatic lobe appears less intense on portal venous phase imaging.  Similar blush of hyperattenuation in the right hepatic lobe December 2008 study, findings indicative of a flash fill hemangioma.  Gallbladder  unremarkable.  Colonoscopy December 2019: --Three 3 to 6 mm polyps in the sigmoid colon, at the hepatic flexure and in the ascending colon, removed with a cold snare. Resected and retrieved.  Tubular adenomas. - Tortuous LEFT colon. - The examination was otherwise normal on direct and retroflexion views. -Next colonoscopy in 3 years       Current Outpatient Medications  Medication Sig Dispense Refill   amLODipine (NORVASC) 5 MG tablet Take 5 mg by mouth daily.     budesonide (ENTOCORT EC) 3 MG 24 hr capsule Take 9 mg by mouth daily. Takes 3 capsules by mouth once daily.     ezetimibe (ZETIA) 10 MG tablet Take 10 mg by mouth daily.     hydrochlorothiazide (MICROZIDE) 12.5 MG capsule Take 12.5 mg by mouth at bedtime.      irbesartan (AVAPRO) 150 MG tablet Take 150 mg by mouth at bedtime.      aspirin EC 81 MG tablet Take 81 mg by mouth at bedtime.  (Patient not taking: Reported on 09/25/2020)     Multiple Vitamins-Minerals (ADULT GUMMY PO) Take 2 tablets by mouth daily. (Patient not taking: Reported on 09/25/2020)     rosuvastatin (CRESTOR) 5 MG tablet Take 5 mg by mouth at bedtime.  (Patient not taking: Reported on 09/25/2020)     No current facility-administered medications for this visit.    Allergies as of 09/25/2020   (No Known Allergies)    ROS:  General: Negative for anorexia, weight loss, fever, chills, fatigue, weakness. ENT:  Negative for hoarseness, difficulty swallowing , nasal congestion. CV: Negative for chest pain, angina, palpitations, dyspnea on exertion, peripheral edema.  Respiratory: Negative for dyspnea at rest, dyspnea on exertion, cough, sputum, wheezing.  GI: See history of present illness. GU:  Negative for dysuria, hematuria, urinary incontinence, urinary frequency, nocturnal urination.  Endo: Negative for unusual weight change.    Physical Examination:   BP 135/64   Pulse 85   Temp 97.8 F (36.6 C) (Temporal)   Ht '5\' 1"'$  (1.549 m)   Wt 134 lb (60.8  kg)   BMI 25.32 kg/m   General: Well-nourished, well-developed in no acute distress.  Eyes: No icterus. Mouth: masked Lungs: Clear to auscultation bilaterally.  Heart: Regular rate and rhythm, no murmurs rubs or gallops.  Abdomen: Bowel sounds are normal, nontender, nondistended, no hepatosplenomegaly or masses, no abdominal bruits or hernia , no rebound or guarding.   Extremities: 2+ lower extremity edema at ankles, 1+ to knees bilaterally. No clubbing or deformities. Neuro: Alert and oriented x 4   Skin: Warm and dry, no jaundice.   Psych: Alert and cooperative, normal mood and affect.  Labs:   Lab Results  Component Value Date   WBC 4.6 05/29/2020   HGB 12.3 05/29/2020   HCT 38.8 05/29/2020   MCV 79.7 (L) 05/29/2020   PLT 272 05/29/2020   Labs from September 03, 2020: Glucose 99, BUN 19, creatinine 0.92, sodium 141, potassium 4.2, albumin 3.7, alkaline phosphatase 294, total bilirubin 0.6, AST 111, ALT 140   Labs from September 07, 2020, patient should be on her phone, total bilirubin 0.5, alkaline phosphatase 233, AST 62, ALT 86.  Imaging Studies: NM Hepato W/EF  Result Date: 09/24/2020 CLINICAL DATA:  Abdominal pain and elevated LFTs EXAM: NUCLEAR MEDICINE HEPATOBILIARY IMAGING WITH GALLBLADDER EF TECHNIQUE: Sequential images of the abdomen were obtained out to 60 minutes following intravenous administration of radiopharmaceutical. After oral ingestion of Ensure, gallbladder ejection fraction was determined. At 60 min, normal ejection fraction is greater than 33%. RADIOPHARMACEUTICALS:  4.6 mCi Tc-36m Choletec IV COMPARISON:  CT May 14, 2020. FINDINGS: There is prompt, uniform radiotracer uptake by the liver with normal filling of the intrahepatic ducts, common bile duct. Gallbladder activity is visualized, consistent with patency of cystic duct (normal < 60 minutes). Additionally there is normal biliary to bowel transit (normal < 60 minutes), consistent with patent common bile duct.  Ensure was administered and the gallbladder appears to empty slowly in incompletely on sequential images. Calculated gallbladder ejection fraction is 22%. (Normal gallbladder ejection fraction with Ensure is greater than 33%.) IMPRESSION: Reduced gallbladder ejection fraction as can be seen with chronic cholecystitis/biliary dyskinesia. Electronically Signed   By: JDahlia BailiffMD   On: 09/24/2020 16:47     Assessment:  Pleasant 75year old female with recent diagnosis of biopsy-proven ASMA positive autoimmune hepatitis, started on prednisone and azathioprine back in April.  Initially seem to tolerate medication.  Developed upper abdominal pain associated with vomiting 1 month ago.  Patient felt like symptoms occurred after taking her medication.  She is not sure if symptoms occurred around the time her azathioprine dose was increased.  On her own she started titrating her medication down.  Has been off medication now for 2 weeks.  Her abdominal pain, vomiting have resolved.  HIDA scan yesterday with slightly diminished gallbladder ejection fraction but no reproduction of her symptoms, therefore symptoms unlikely related to gallbladder disease.  I suspect she may have had some gastritis, possibly related to  prednisone.  We discussed options of monitoring for current symptoms versus considering upper endoscopy now.  She is most comfortable with holding off on upper endoscopy at this time.   Plan: F/U with [facility] on Monday as scheduled.  Pantoprazole 40 mg daily for 30 days.  If symptoms completely resolved, she can stop medication, otherwise we will continue for total of 8 weeks. If she has recurrent abdominal pain or vomiting at any time, she will call. Would consider upper endoscopy at that point. Return ov in 3 months. Will schedule colonoscopy at that time.

## 2020-09-25 NOTE — Patient Instructions (Signed)
Start pantoprazole for possible gastritis (inflammation of the stomach). You will take one daily before breakfast.  Please take for 30 days.  If your symptoms have resolved, you can stop the medication at that point.  Otherwise continued for an additional 30 days. If it anytime you feel her symptoms, your abdominal pain, vomiting or not improving or return, please let me know and we will consider upper endoscopy. Continue to follow with Roosevelt Locks, NP for your liver. Please discuss leg edema with Dr. Berdine Addison. Today you have 2+ edema at the ankles and 1+ edema to the knees.  You can give him this terminology and he will have a good understanding of how much edema that you have today. Try to avoid canned vegetables, processed meats, salty meats, frozen meals.  Return to the office in 3 months or call sooner if needed.

## 2020-10-09 DIAGNOSIS — Z79899 Other long term (current) drug therapy: Secondary | ICD-10-CM | POA: Diagnosis not present

## 2020-10-09 DIAGNOSIS — D84821 Immunodeficiency due to drugs: Secondary | ICD-10-CM | POA: Diagnosis not present

## 2020-10-09 DIAGNOSIS — K754 Autoimmune hepatitis: Secondary | ICD-10-CM | POA: Diagnosis not present

## 2020-10-14 DIAGNOSIS — I10 Essential (primary) hypertension: Secondary | ICD-10-CM | POA: Diagnosis not present

## 2020-10-14 DIAGNOSIS — D84821 Immunodeficiency due to drugs: Secondary | ICD-10-CM | POA: Diagnosis not present

## 2020-10-14 DIAGNOSIS — R600 Localized edema: Secondary | ICD-10-CM | POA: Diagnosis not present

## 2020-10-14 DIAGNOSIS — E782 Mixed hyperlipidemia: Secondary | ICD-10-CM | POA: Diagnosis not present

## 2020-10-14 DIAGNOSIS — K754 Autoimmune hepatitis: Secondary | ICD-10-CM | POA: Diagnosis not present

## 2020-10-23 DIAGNOSIS — E7849 Other hyperlipidemia: Secondary | ICD-10-CM | POA: Diagnosis not present

## 2020-10-23 DIAGNOSIS — I10 Essential (primary) hypertension: Secondary | ICD-10-CM | POA: Diagnosis not present

## 2020-10-29 ENCOUNTER — Other Ambulatory Visit: Payer: Self-pay

## 2020-10-29 ENCOUNTER — Ambulatory Visit: Payer: Medicare Other | Admitting: Cardiology

## 2020-10-29 ENCOUNTER — Encounter: Payer: Self-pay | Admitting: Cardiology

## 2020-10-29 VITALS — BP 142/72 | HR 82 | Temp 98.2°F | Resp 16 | Ht 61.0 in | Wt 136.0 lb

## 2020-10-29 DIAGNOSIS — E782 Mixed hyperlipidemia: Secondary | ICD-10-CM

## 2020-10-29 DIAGNOSIS — I10 Essential (primary) hypertension: Secondary | ICD-10-CM | POA: Diagnosis not present

## 2020-10-29 DIAGNOSIS — M7989 Other specified soft tissue disorders: Secondary | ICD-10-CM

## 2020-10-29 DIAGNOSIS — N183 Chronic kidney disease, stage 3 unspecified: Secondary | ICD-10-CM | POA: Diagnosis not present

## 2020-10-29 DIAGNOSIS — I129 Hypertensive chronic kidney disease with stage 1 through stage 4 chronic kidney disease, or unspecified chronic kidney disease: Secondary | ICD-10-CM | POA: Diagnosis not present

## 2020-10-29 MED ORDER — HYDROCHLOROTHIAZIDE 25 MG PO TABS: 25.0000 mg | ORAL_TABLET | Freq: Every morning | ORAL | 0 refills | Status: DC

## 2020-10-29 NOTE — Progress Notes (Signed)
Date:  10/29/2020   ID:  [name], DOB 05-14-1945, MRN [id number]  PCP:  Iona Beard, MD  Cardiologist:  Rex Kras, DO, [facility] (established care 10/29/2020)  REASON FOR CONSULT: Bilateral Leg Swelling   REQUESTING PHYSICIAN:  Iona Beard, MD [address] [place],  [place] [address]  Chief Complaint  Patient presents with   Edema   New Patient (Initial Visit)    HPI  [name] is a 75 y.o. female who presents to the office with a chief complaint of "leg swelling." Patient's past medical history and cardiovascular risk factors include: Autoimmune hepatitis, hypertension, hyperlipidemia, advanced age, postmenopausal female.  She is referred to the office at the request of Iona Beard, MD for evaluation of bilateral leg swelling.  Over the last 3 weeks that she is been experiencing bilateral lower extremity swelling up to the mid shin.  At times the swelling does not improve in the morning but very noticeable during the daytime.  Patient states that she is try compression stockings without any significant relief.  No prior history of DVT or PE.  No history of immobilization, prolonged car rides or plane rides, surgeries, or history of cancer.  Patient recently had a liver biopsy in May 2022 and is currently being treated for autoimmune hepatitis.  She was started on amlodipine couple months ago for benign essential hypertension management by PCP.  She denies any chest pain at rest or with effort related activities.  FUNCTIONAL STATUS: Walks 5 miles per day.    ALLERGIES: No Known Allergies  MEDICATION LIST PRIOR TO VISIT: Current Meds  Medication Sig   aspirin EC 81 MG tablet Take 81 mg by mouth daily. Swallow whole.   budesonide (ENTOCORT EC) 3 MG 24 hr capsule Take 9 mg by mouth daily. Takes 3 capsules by mouth once daily.   ezetimibe (ZETIA) 10 MG tablet Take 10 mg by mouth daily.   hydrochlorothiazide (HYDRODIURIL) 25 MG tablet Take 1 tablet (25 mg total) by  mouth every morning.   irbesartan (AVAPRO) 150 MG tablet Take 150 mg by mouth at bedtime.    potassium chloride (KLOR-CON) 10 MEQ tablet Take 10 mEq by mouth every other day.   [DISCONTINUED] amLODipine (NORVASC) 5 MG tablet Take 5 mg by mouth daily.   [DISCONTINUED] furosemide (LASIX) 20 MG tablet Take 20 mg by mouth daily.     PAST MEDICAL HISTORY: Past Medical History:  Diagnosis Date   Autoimmune hepatitis ([place])    High cholesterol    Hypertension     PAST SURGICAL HISTORY: Past Surgical History:  Procedure Laterality Date   COLONOSCOPY N/A 01/28/2018   Procedure: COLONOSCOPY;  Surgeon: Danie Binder, MD;  Location: [facility];  Service: Endoscopy;  Laterality: N/A;  8:30   LAPAROSCOPIC HYSTERECTOMY     POLYPECTOMY  01/28/2018   Procedure: POLYPECTOMY;  Surgeon: Danie Binder, MD;  Location: [facility];  Service: Endoscopy;;  colon    FAMILY HISTORY: The patient family history includes Stroke in her mother.  SOCIAL HISTORY:  The patient  reports that she has never smoked. She has never used smokeless tobacco. She reports that she does not drink alcohol and does not use drugs.  REVIEW OF SYSTEMS: Review of Systems  Constitutional: Negative for chills and fever.  HENT:  Negative for hoarse voice and nosebleeds.   Eyes:  Negative for discharge, double vision and pain.  Cardiovascular:  Positive for leg swelling. Negative for chest pain,  claudication, dyspnea on exertion, near-syncope, orthopnea, palpitations, paroxysmal nocturnal dyspnea and syncope.  Respiratory:  Negative for hemoptysis and shortness of breath.   Musculoskeletal:  Negative for muscle cramps and myalgias.  Gastrointestinal:  Negative for abdominal pain, constipation, diarrhea, hematemesis, hematochezia, melena, nausea and vomiting.  Neurological:  Negative for dizziness and light-headedness.   PHYSICAL EXAM: Vitals with BMI 10/29/2020 09/25/2020 05/29/2020  Height '5\' 1"'$  '5\' 1"'$  -  Weight 136 lbs 134 lbs  -  BMI 0000000 0000000 -  Systolic [id number]  Diastolic 72 64 67  Pulse 82 85 -    CONSTITUTIONAL: Well-developed and well-nourished. No acute distress.  SKIN: Skin is warm and dry. No rash noted. No cyanosis. No pallor. No jaundice HEAD: Normocephalic and atraumatic.  EYES: No scleral icterus MOUTH/THROAT: Moist oral membranes.  NECK: No JVD present. No thyromegaly noted. No carotid bruits  LYMPHATIC: No visible cervical adenopathy.  CHEST Normal respiratory effort. No intercostal retractions  LUNGS: Clear to auscultation bilaterally. No stridor. No wheezes. No rales.  CARDIOVASCULAR: Regular rate and rhythm, positive S1-S2, no murmurs rubs or gallops appreciated.  ABDOMINAL: No apparent ascites.  EXTREMITIES: +1 bilateral peripheral edema, 2+ DP and PT pulses.  Warm to touch. HEMATOLOGIC: No significant bruising NEUROLOGIC: Oriented to person, place, and time. Nonfocal. Normal muscle tone.  PSYCHIATRIC: Normal mood and affect. Normal behavior. Cooperative  CARDIAC DATABASE: EKG: 10/29/2020: Normal sinus rhythm, 76 bpm, without underlying ischemia or injury pattern.  Echocardiogram: No results found for this or any previous visit from the past 1095 days.   Stress Testing: No results found for this or any previous visit from the past 1095 days.  Heart Catheterization: None  LABORATORY DATA: CBC Latest Ref Rng & Units 05/29/2020 12/24/2013 12/12/2013  WBC 4.0 - 10.5 K/uL 4.6 8.5 6.8  Hemoglobin 12.0 - 15.0 g/dL 12.3 12.2 10.9(L)  Hematocrit 36.0 - 46.0 % 38.8 37.1 32.3(L)  Platelets 150 - 400 K/uL 272 515(H) 249    CMP Latest Ref Rng & Units 05/14/2020 12/24/2013 12/12/2013  Glucose 70 - 99 mg/dL - 132(H) 98  BUN 6 - 23 mg/dL - 33(H) 10  Creatinine 0.44 - 1.00 mg/dL 0.70 1.28(H) 0.99  Sodium 137 - 147 mEq/L - 138 138  Potassium 3.7 - 5.3 mEq/L - 3.4(L) 3.1(L)  Chloride 96 - 112 mEq/L - 99 97  CO2 19 - 32 mEq/L - 22 30  Calcium 8.4 - 10.5 mg/dL - 9.2 9.4  Total Protein 6.0 -  8.3 g/dL - 8.1 7.8  Total Bilirubin 0.3 - 1.2 mg/dL - 1.0 2.1(H)  Alkaline Phos 39 - 117 U/L - 233(H) 432(H)  AST 0 - 37 U/L - 24 108(H)  ALT 0 - 35 U/L - 20 218(H)    Lipid Panel     Component Value Date/Time   CHOL (H) 10/29/2008 0305    206        ATP III CLASSIFICATION:  <200     mg/dL   Desirable  200-239  mg/dL   Borderline High  >=240    mg/dL   High          TRIG 78 10/29/2008 0305   HDL 59 10/29/2008 0305   CHOLHDL 3.5 10/29/2008 0305   VLDL 16 10/29/2008 0305   LDLCALC (H) 10/29/2008 0305    131        Total Cholesterol/HDL:CHD Risk Coronary Heart Disease Risk Table  Men   Women  1/2 Average Risk   3.4   3.3  Average Risk       5.0   4.4  2 X Average Risk   9.6   7.1  3 X Average Risk  23.4   11.0        Use the calculated Patient Ratio above and the CHD Risk Table to determine the patient's CHD Risk.        ATP III CLASSIFICATION (LDL):  <100     mg/dL   Optimal  100-129  mg/dL   Near or Above                    Optimal  130-159  mg/dL   Borderline  160-189  mg/dL   High  >190     mg/dL   Very High    No components found for: NTPROBNP No results for input(s): PROBNP in the last 8760 hours. No results for input(s): TSH in the last 8760 hours.  BMP Recent Labs    05/14/20 1414  CREATININE 0.70    HEMOGLOBIN A1C No results found for: HGBA1C, MPG  IMPRESSION:    ICD-10-CM   1. Leg swelling  M79.89 hydrochlorothiazide (HYDRODIURIL) 25 MG tablet    B Nat Peptide    VAS [place] LOWER EXTREMITY VENOUS (DVT)    2. Benign hypertension with CKD (chronic kidney disease) stage III (HCC)  I12.9 EKG 12-Lead   N18.30 hydrochlorothiazide (HYDRODIURIL) 25 MG tablet    Basic metabolic panel    Magnesium    B Nat Peptide    PCV ECHOCARDIOGRAM COMPLETE    3. Mixed hyperlipidemia  E78.2        RECOMMENDATIONS: [name] is a 75 y.o. female whose past medical history and cardiac risk factors include: Autoimmune hepatitis,  hypertension, hyperlipidemia, advanced age, postmenopausal female.  Bilateral lower extremity swelling: Most likely secondary to amlodipine and a component of chronic venous insufficiency. Discontinue amlodipine for now. Check BNP. Check lower extremity duplex to evaluate for deep venous thrombosis and chronic venous insufficiency.. Monitor for now  Hypertension with chronic kidney disease: Discontinue amlodipine secondary to lower extremity swelling. Add hydrochlorothiazide 25 mg p.o. every morning. Will hold Lasix for now. Check BMP, magnesium, NT proBNP in 1 week to evaluate for kidney function and electrolytes. Echocardiogram will be ordered to evaluate for structural heart disease and left ventricular systolic function.  Hyperlipidemia: Currently on Zetia. Currently managed by primary care provider.  FINAL MEDICATION LIST END OF ENCOUNTER: Meds ordered this encounter  Medications   hydrochlorothiazide (HYDRODIURIL) 25 MG tablet    Sig: Take 1 tablet (25 mg total) by mouth every morning.    Dispense:  90 tablet    Refill:  0    Medications Discontinued During This Encounter  Medication Reason   hydrochlorothiazide (MICROZIDE) 12.5 MG capsule Error   pantoprazole (PROTONIX) 40 MG tablet Error   amLODipine (NORVASC) 5 MG tablet    furosemide (LASIX) 20 MG tablet      Current Outpatient Medications:    aspirin EC 81 MG tablet, Take 81 mg by mouth daily. Swallow whole., Disp: , Rfl:    budesonide (ENTOCORT EC) 3 MG 24 hr capsule, Take 9 mg by mouth daily. Takes 3 capsules by mouth once daily., Disp: , Rfl:    ezetimibe (ZETIA) 10 MG tablet, Take 10 mg by mouth daily., Disp: , Rfl:    hydrochlorothiazide (HYDRODIURIL) 25 MG tablet, Take 1 tablet (25 mg  total) by mouth every morning., Disp: 90 tablet, Rfl: 0   irbesartan (AVAPRO) 150 MG tablet, Take 150 mg by mouth at bedtime. , Disp: , Rfl:    potassium chloride (KLOR-CON) 10 MEQ tablet, Take 10 mEq by mouth every other day.,  Disp: , Rfl:   Orders Placed This Encounter  Procedures   Basic metabolic panel   Magnesium   B Nat Peptide   EKG 12-Lead   PCV ECHOCARDIOGRAM COMPLETE   VAS [place] LOWER EXTREMITY VENOUS (DVT)    There are no Patient Instructions on file for this visit.   --Continue cardiac medications as reconciled in final medication list. --Return in about 4 weeks (around 11/26/2020) for Follow up leg swelling and labs. . Or sooner if needed. --Continue follow-up with your primary care physician regarding the management of your other chronic comorbid conditions.  Patient's questions and concerns were addressed to her satisfaction. She voices understanding of the instructions provided during this encounter.   This note was created using a voice recognition software as a result there may be grammatical errors inadvertently enclosed that do not reflect the nature of this encounter. Every attempt is made to correct such errors.  Rex Kras, [place], [facility]  Pager: [phone] Office: [phone]

## 2020-11-07 DIAGNOSIS — I130 Hypertensive heart and chronic kidney disease with heart failure and stage 1 through stage 4 chronic kidney disease, or unspecified chronic kidney disease: Secondary | ICD-10-CM | POA: Diagnosis not present

## 2020-11-07 DIAGNOSIS — M7989 Other specified soft tissue disorders: Secondary | ICD-10-CM | POA: Diagnosis not present

## 2020-11-07 DIAGNOSIS — I10 Essential (primary) hypertension: Secondary | ICD-10-CM | POA: Diagnosis not present

## 2020-11-08 ENCOUNTER — Other Ambulatory Visit: Payer: Self-pay

## 2020-11-08 DIAGNOSIS — I129 Hypertensive chronic kidney disease with stage 1 through stage 4 chronic kidney disease, or unspecified chronic kidney disease: Secondary | ICD-10-CM

## 2020-11-08 LAB — BRAIN NATRIURETIC PEPTIDE: BNP: 44.7 pg/mL (ref 0.0–100.0)

## 2020-11-18 DIAGNOSIS — R600 Localized edema: Secondary | ICD-10-CM | POA: Diagnosis not present

## 2020-11-18 DIAGNOSIS — Z6825 Body mass index (BMI) 25.0-25.9, adult: Secondary | ICD-10-CM | POA: Diagnosis not present

## 2020-11-18 DIAGNOSIS — R7401 Elevation of levels of liver transaminase levels: Secondary | ICD-10-CM | POA: Diagnosis not present

## 2020-11-18 DIAGNOSIS — M542 Cervicalgia: Secondary | ICD-10-CM | POA: Diagnosis not present

## 2020-11-18 DIAGNOSIS — K754 Autoimmune hepatitis: Secondary | ICD-10-CM | POA: Diagnosis not present

## 2020-11-18 DIAGNOSIS — Z7189 Other specified counseling: Secondary | ICD-10-CM | POA: Diagnosis not present

## 2020-11-18 DIAGNOSIS — D84821 Immunodeficiency due to drugs: Secondary | ICD-10-CM | POA: Diagnosis not present

## 2020-11-18 DIAGNOSIS — R970 Elevated carcinoembryonic antigen [CEA]: Secondary | ICD-10-CM | POA: Diagnosis not present

## 2020-11-18 DIAGNOSIS — E785 Hyperlipidemia, unspecified: Secondary | ICD-10-CM | POA: Diagnosis not present

## 2020-11-18 DIAGNOSIS — I10 Essential (primary) hypertension: Secondary | ICD-10-CM | POA: Diagnosis not present

## 2020-11-18 DIAGNOSIS — E782 Mixed hyperlipidemia: Secondary | ICD-10-CM | POA: Diagnosis not present

## 2020-11-19 ENCOUNTER — Ambulatory Visit: Payer: Medicare Other

## 2020-11-19 ENCOUNTER — Other Ambulatory Visit: Payer: Self-pay

## 2020-11-19 DIAGNOSIS — I129 Hypertensive chronic kidney disease with stage 1 through stage 4 chronic kidney disease, or unspecified chronic kidney disease: Secondary | ICD-10-CM

## 2020-11-19 DIAGNOSIS — N183 Chronic kidney disease, stage 3 unspecified: Secondary | ICD-10-CM | POA: Diagnosis not present

## 2020-11-19 DIAGNOSIS — M7989 Other specified soft tissue disorders: Secondary | ICD-10-CM

## 2020-11-22 DIAGNOSIS — E7849 Other hyperlipidemia: Secondary | ICD-10-CM | POA: Diagnosis not present

## 2020-11-22 DIAGNOSIS — I10 Essential (primary) hypertension: Secondary | ICD-10-CM | POA: Diagnosis not present

## 2020-12-02 DIAGNOSIS — E876 Hypokalemia: Secondary | ICD-10-CM | POA: Diagnosis not present

## 2020-12-17 DIAGNOSIS — E876 Hypokalemia: Secondary | ICD-10-CM | POA: Diagnosis not present

## 2021-01-28 DIAGNOSIS — Z6825 Body mass index (BMI) 25.0-25.9, adult: Secondary | ICD-10-CM | POA: Diagnosis not present

## 2021-01-28 DIAGNOSIS — I10 Essential (primary) hypertension: Secondary | ICD-10-CM | POA: Diagnosis not present

## 2021-01-28 DIAGNOSIS — E785 Hyperlipidemia, unspecified: Secondary | ICD-10-CM | POA: Diagnosis not present

## 2021-01-28 DIAGNOSIS — K754 Autoimmune hepatitis: Secondary | ICD-10-CM | POA: Diagnosis not present

## 2021-01-29 ENCOUNTER — Encounter: Payer: Self-pay | Admitting: *Deleted

## 2021-02-04 ENCOUNTER — Other Ambulatory Visit: Payer: Self-pay | Admitting: Cardiology

## 2021-02-04 DIAGNOSIS — I129 Hypertensive chronic kidney disease with stage 1 through stage 4 chronic kidney disease, or unspecified chronic kidney disease: Secondary | ICD-10-CM

## 2021-02-04 DIAGNOSIS — M7989 Other specified soft tissue disorders: Secondary | ICD-10-CM

## 2021-02-04 DIAGNOSIS — N183 Chronic kidney disease, stage 3 unspecified: Secondary | ICD-10-CM

## 2021-02-21 ENCOUNTER — Other Ambulatory Visit (HOSPITAL_COMMUNITY): Payer: Self-pay | Admitting: Family Medicine

## 2021-02-21 DIAGNOSIS — Z1231 Encounter for screening mammogram for malignant neoplasm of breast: Secondary | ICD-10-CM

## 2021-04-01 NOTE — H&P (View-Only) (Signed)
Primary Care Physician:  Iona Beard, MD  Primary Gastroenterologist:  Elon Alas. Abbey Chatters, DO   Chief Complaint  Patient presents with   Colonoscopy    Due for tcs    HPI:  [name] is a 76 y.o. female here for further evaluation of autoimmune hepatitis and 3-year surveillance colonoscopy.  Patient has a history of biopsy-proven ASMA positive autoimmune hepatitis diagnosed in early 2022.  Unable to determine degree of fibrosis on biopsy but did not show any obvious evidence of advanced fibrosis.  Followed at [facility], started on prednisone and azathioprine back in April 2022.Her liver enzymes increased with reduction of prednisone, patient did not tolerate increased dose of azathioprine due to nausea and vomiting.  She was started on budesonide 9 mg daily in July with normalization of her LFTs and IgG.  Last colonoscopy was in December 2019.  She had 3 tubular adenomas removed, tortuous left colon, recommended 3-year surveillance exam.  Patient states she is only on budesonide now. She says she was taken off azathioprine. Since her last visit with [place] she had to see cardiology for lower extremity edema. She had ECHO. Her cardiologist stopped her amlodipine. HCTZ was added. She was told to follow up in one year.  Some mild fluid in the legs last week. Small mildly erythematous area on the anterior shin left lower leg. Mildly tender. No known injury.    No abdominal pain, heartburn, vomiting, dysuria. No melena, brbpr. BM 3-4 per week is normal. No dysphagia. No unintentional weight loss. Tries to eat fresh foods. Very little canned foods. No frozen meals. Watches her salt intake.   Current Outpatient Medications  Medication Sig Dispense Refill   budesonide (ENTOCORT EC) 3 MG 24 hr capsule Take 9 mg by mouth daily. Takes 3 capsules by mouth once daily.     ezetimibe (ZETIA) 10 MG tablet Take 10 mg by mouth daily.     hydrochlorothiazide (HYDRODIURIL) 25 MG tablet TAKE ONE  TABLET (25MG  TOTAL) BY MOUTH EVERY MORNING 90 tablet 0   irbesartan (AVAPRO) 150 MG tablet Take 150 mg by mouth at bedtime.      potassium chloride (KLOR-CON) 10 MEQ tablet Take 10 mEq by mouth every other day.     No current facility-administered medications for this visit.    Allergies as of 04/02/2021   (No Known Allergies)    Past Medical History:  Diagnosis Date   Autoimmune hepatitis ([place])    High cholesterol    Hypertension     Past Surgical History:  Procedure Laterality Date   COLONOSCOPY N/A 01/28/2018   Procedure: COLONOSCOPY;  Surgeon: Danie Binder, MD;  [facility];  Service: Endoscopy;  Laterality: N/A;  8:30   LAPAROSCOPIC HYSTERECTOMY     POLYPECTOMY  01/28/2018   Procedure: POLYPECTOMY;  Surgeon: Danie Binder, MD;  Location: [facility];  Service: Endoscopy;;  colon    Family History  Problem Relation Age of Onset   Stroke Mother    Colon cancer Neg Hx    Colon polyps Neg Hx     Social History   Socioeconomic History   Marital status: Married    Spouse name: Not on file   Number of children: Not on file   Years of education: Not on file   Highest education level: Not on file  Occupational History   Not on file  Tobacco Use   Smoking status: Never   Smokeless tobacco: Never  Vaping Use  Vaping Use: Never used  Substance and Sexual Activity   Alcohol use: No   Drug use: No   Sexual activity: Yes    Birth control/protection: Surgical  Other Topics Concern   Not on file  Social History Narrative   MARRIED. 2 SONS-5 GRANDKIDS. RETIRED FROM [employer].   Social Determinants of Health   Financial Resource Strain: Not on file  Food Insecurity: Not on file  Transportation Needs: Not on file  Physical Activity: Not on file  Stress: Not on file  Social Connections: Not on file  Intimate Partner Violence: Not on file      ROS:  General: Negative for anorexia, weight loss, fever, chills, fatigue, weakness. Eyes: Negative  for vision changes.  ENT: Negative for hoarseness, difficulty swallowing , nasal congestion. CV: Negative for chest pain, angina, palpitations, dyspnea on exertion, peripheral edema.  Respiratory: Negative for dyspnea at rest, dyspnea on exertion, cough, sputum, wheezing.  GI: See history of present illness. GU:  Negative for dysuria, hematuria, urinary incontinence, urinary frequency, nocturnal urination.  MS: Negative for joint pain, low back pain.  Derm: Negative for rash or itching.  Neuro: Negative for weakness, abnormal sensation, seizure, frequent headaches, memory loss, confusion.  Psych: Negative for anxiety, depression, suicidal ideation, hallucinations.  Endo: Negative for unusual weight change.  Heme: Negative for bruising or bleeding. Allergy: Negative for rash or hives.    Physical Examination:  BP 138/78    Pulse 75    Temp (!) 97.3 F (36.3 C) (Temporal)    Ht 5\' 1"  (1.549 m)    Wt 129 lb 3.2 oz (58.6 kg)    BMI 24.41 kg/m    General: Well-nourished, well-developed in no acute distress.  Head: Normocephalic, atraumatic.   Eyes: Conjunctiva pink, no icterus. Mouth: masked Neck: Supple without thyromegaly, masses, or lymphadenopathy.  Lungs: Clear to auscultation bilaterally.  Heart: Regular rate and rhythm, no murmurs rubs or gallops.  Abdomen: Bowel sounds are normal, nontender, nondistended, no hepatosplenomegaly or masses, no abdominal bruits or    hernia , no rebound or guarding.   Rectal: not performed Extremities: trace bilateral lower extremity edema. Small mildly erythematous area on anterior shin left lower leg. No wound or scratch. Mildly tender.  No clubbing or deformities.  Neuro: Alert and oriented x 4 , grossly normal neurologically.  Skin: Warm and dry, no rash or jaundice.   Psych: Alert and cooperative, normal mood and affect.   Imaging Studies: No results found.   Assessment:  Autoimmune hepatitis: followed by [facility]. Has an appointment  Monday. Will be due for labs at that time. Await records.   H/o adenomatous colon polyps: due for surveillance colonoscopy at this time.   Lower extremity edema: trace. Evaluated by cardiology in 10/2020. Small area of erythema noted on anterior lower left leg, does not appear to be cellulitis. ?etiology. Encouraged her to monitor closely for any worsening and consider evaluation by her PCP in the next 24 hours.      Plan: Colonoscopy with Dr. Abbey Chatters.  ASA 2.  I have discussed the risks, alternatives, benefits with regards to but not limited to the risk of reaction to medication, bleeding, infection, perforation and the patient is agreeable to proceed. Written consent to be obtained. Will await upcoming labs. Discussed left lower leg with PCP.

## 2021-04-01 NOTE — Progress Notes (Signed)
Primary Care Physician:  Iona Beard, MD  Primary Gastroenterologist:  Elon Alas. Abbey Chatters, DO   Chief Complaint  Patient presents with   Colonoscopy    Due for tcs    HPI:  [name] is a 76 y.o. female here for further evaluation of autoimmune hepatitis and 3-year surveillance colonoscopy.  Patient has a history of biopsy-proven ASMA positive autoimmune hepatitis diagnosed in early 2022.  Unable to determine degree of fibrosis on biopsy but did not show any obvious evidence of advanced fibrosis.  Followed at [facility], started on prednisone and azathioprine back in April 2022.Her liver enzymes increased with reduction of prednisone, patient did not tolerate increased dose of azathioprine due to nausea and vomiting.  She was started on budesonide 9 mg daily in July with normalization of her LFTs and IgG.  Last colonoscopy was in December 2019.  She had 3 tubular adenomas removed, tortuous left colon, recommended 3-year surveillance exam.  Patient states she is only on budesonide now. She says she was taken off azathioprine. Since her last visit with [place] she had to see cardiology for lower extremity edema. She had ECHO. Her cardiologist stopped her amlodipine. HCTZ was added. She was told to follow up in one year.  Some mild fluid in the legs last week. Small mildly erythematous area on the anterior shin left lower leg. Mildly tender. No known injury.    No abdominal pain, heartburn, vomiting, dysuria. No melena, brbpr. BM 3-4 per week is normal. No dysphagia. No unintentional weight loss. Tries to eat fresh foods. Very little canned foods. No frozen meals. Watches her salt intake.   Current Outpatient Medications  Medication Sig Dispense Refill   budesonide (ENTOCORT EC) 3 MG 24 hr capsule Take 9 mg by mouth daily. Takes 3 capsules by mouth once daily.     ezetimibe (ZETIA) 10 MG tablet Take 10 mg by mouth daily.     hydrochlorothiazide (HYDRODIURIL) 25 MG tablet TAKE ONE  TABLET (25MG  TOTAL) BY MOUTH EVERY MORNING 90 tablet 0   irbesartan (AVAPRO) 150 MG tablet Take 150 mg by mouth at bedtime.      potassium chloride (KLOR-CON) 10 MEQ tablet Take 10 mEq by mouth every other day.     No current facility-administered medications for this visit.    Allergies as of 04/02/2021   (No Known Allergies)    Past Medical History:  Diagnosis Date   Autoimmune hepatitis ([place])    High cholesterol    Hypertension     Past Surgical History:  Procedure Laterality Date   COLONOSCOPY N/A 01/28/2018   Procedure: COLONOSCOPY;  Surgeon: Danie Binder, MD;  [facility];  Service: Endoscopy;  Laterality: N/A;  8:30   LAPAROSCOPIC HYSTERECTOMY     POLYPECTOMY  01/28/2018   Procedure: POLYPECTOMY;  Surgeon: Danie Binder, MD;  [facility];  Service: Endoscopy;;  colon    Family History  Problem Relation Age of Onset   Stroke Mother    Colon cancer Neg Hx    Colon polyps Neg Hx     Social History   Socioeconomic History   Marital status: Married    Spouse name: Not on file   Number of children: Not on file   Years of education: Not on file   Highest education level: Not on file  Occupational History   Not on file  Tobacco Use   Smoking status: Never   Smokeless tobacco: Never  Vaping Use  Vaping Use: Never used  Substance and Sexual Activity   Alcohol use: No   Drug use: No   Sexual activity: Yes    Birth control/protection: Surgical  Other Topics Concern   Not on file  Social History Narrative   MARRIED. 2 SONS-5 GRANDKIDS. RETIRED FROM [employer].   Social Determinants of Health   Financial Resource Strain: Not on file  Food Insecurity: Not on file  Transportation Needs: Not on file  Physical Activity: Not on file  Stress: Not on file  Social Connections: Not on file  Intimate Partner Violence: Not on file      ROS:  General: Negative for anorexia, weight loss, fever, chills, fatigue, weakness. Eyes: Negative  for vision changes.  ENT: Negative for hoarseness, difficulty swallowing , nasal congestion. CV: Negative for chest pain, angina, palpitations, dyspnea on exertion, peripheral edema.  Respiratory: Negative for dyspnea at rest, dyspnea on exertion, cough, sputum, wheezing.  GI: See history of present illness. GU:  Negative for dysuria, hematuria, urinary incontinence, urinary frequency, nocturnal urination.  MS: Negative for joint pain, low back pain.  Derm: Negative for rash or itching.  Neuro: Negative for weakness, abnormal sensation, seizure, frequent headaches, memory loss, confusion.  Psych: Negative for anxiety, depression, suicidal ideation, hallucinations.  Endo: Negative for unusual weight change.  Heme: Negative for bruising or bleeding. Allergy: Negative for rash or hives.    Physical Examination:  BP 138/78    Pulse 75    Temp (!) 97.3 F (36.3 C) (Temporal)    Ht 5\' 1"  (1.549 m)    Wt 129 lb 3.2 oz (58.6 kg)    BMI 24.41 kg/m    General: Well-nourished, well-developed in no acute distress.  Head: Normocephalic, atraumatic.   Eyes: Conjunctiva pink, no icterus. Mouth: masked Neck: Supple without thyromegaly, masses, or lymphadenopathy.  Lungs: Clear to auscultation bilaterally.  Heart: Regular rate and rhythm, no murmurs rubs or gallops.  Abdomen: Bowel sounds are normal, nontender, nondistended, no hepatosplenomegaly or masses, no abdominal bruits or    hernia , no rebound or guarding.   Rectal: not performed Extremities: trace bilateral lower extremity edema. Small mildly erythematous area on anterior shin left lower leg. No wound or scratch. Mildly tender.  No clubbing or deformities.  Neuro: Alert and oriented x 4 , grossly normal neurologically.  Skin: Warm and dry, no rash or jaundice.   Psych: Alert and cooperative, normal mood and affect.   Imaging Studies: No results found.   Assessment:  Autoimmune hepatitis: followed by [facility]. Has an appointment  Monday. Will be due for labs at that time. Await records.   H/o adenomatous colon polyps: due for surveillance colonoscopy at this time.   Lower extremity edema: trace. Evaluated by cardiology in 10/2020. Small area of erythema noted on anterior lower left leg, does not appear to be cellulitis. ?etiology. Encouraged her to monitor closely for any worsening and consider evaluation by her PCP in the next 24 hours.      Plan: Colonoscopy with Dr. Abbey Chatters.  ASA 2.  I have discussed the risks, alternatives, benefits with regards to but not limited to the risk of reaction to medication, bleeding, infection, perforation and the patient is agreeable to proceed. Written consent to be obtained. Will await upcoming labs. Discussed left lower leg with PCP.

## 2021-04-02 ENCOUNTER — Other Ambulatory Visit: Payer: Self-pay

## 2021-04-02 ENCOUNTER — Emergency Department (HOSPITAL_COMMUNITY)
Admission: EM | Admit: 2021-04-02 | Discharge: 2021-04-02 | Disposition: A | Discharge status: Home / Self Care | Payer: Medicare Other | Attending: Emergency Medicine | Admitting: Emergency Medicine

## 2021-04-02 ENCOUNTER — Encounter: Payer: Self-pay | Admitting: *Deleted

## 2021-04-02 ENCOUNTER — Emergency Department (HOSPITAL_COMMUNITY): Payer: Medicare Other

## 2021-04-02 ENCOUNTER — Encounter (HOSPITAL_COMMUNITY): Payer: Self-pay | Admitting: *Deleted

## 2021-04-02 ENCOUNTER — Telehealth: Payer: Self-pay | Admitting: Gastroenterology

## 2021-04-02 ENCOUNTER — Ambulatory Visit (INDEPENDENT_AMBULATORY_CARE_PROVIDER_SITE_OTHER): Payer: Medicare Other | Admitting: Gastroenterology

## 2021-04-02 ENCOUNTER — Encounter: Payer: Self-pay | Admitting: Gastroenterology

## 2021-04-02 DIAGNOSIS — Z86010 Personal history of colonic polyps: Secondary | ICD-10-CM | POA: Diagnosis not present

## 2021-04-02 DIAGNOSIS — M7989 Other specified soft tissue disorders: Secondary | ICD-10-CM | POA: Insufficient documentation

## 2021-04-02 DIAGNOSIS — K754 Autoimmune hepatitis: Secondary | ICD-10-CM | POA: Diagnosis not present

## 2021-04-02 DIAGNOSIS — L03116 Cellulitis of left lower limb: Secondary | ICD-10-CM | POA: Diagnosis not present

## 2021-04-02 DIAGNOSIS — R600 Localized edema: Secondary | ICD-10-CM | POA: Insufficient documentation

## 2021-04-02 MED ORDER — DOXYCYCLINE HYCLATE 100 MG PO CAPS: 100.0000 mg | ORAL_CAPSULE | Freq: Two times a day (BID) | ORAL | 0 refills | Status: DC

## 2021-04-02 NOTE — ED Provider Notes (Signed)
[facility] EMERGENCY DEPARTMENT Provider Note   CSN: [id number] Arrival date & time: 04/02/21  1504     History  Chief Complaint  Patient presents with   Leg Swelling    [name] is a 76 y.o. female.  She is here with a tender red area on her left lower leg.  She does not recall any trauma.  Her gastroenterologist noticed it and recommended that she come get an ultrasound to make sure she does not have a blood clot.  No fevers or chills no chest pain or shortness of breath.  The history is provided by the patient.  Leg Pain Location:  Leg Leg location:  L lower leg Pain details:    Quality: Tender.   Radiates to:  Does not radiate   Severity:  Mild   Onset quality:  Gradual   Duration:  1 week   Timing:  Intermittent   Progression:  Unchanged Chronicity:  New Relieved by:  None tried Worsened by:  Nothing Ineffective treatments:  None tried Associated symptoms: swelling   Associated symptoms: no fever       Home Medications Prior to Admission medications   Medication Sig Start Date End Date Taking? Authorizing Provider  budesonide (ENTOCORT EC) 3 MG 24 hr capsule Take 9 mg by mouth daily. Takes 3 capsules by mouth once daily. 09/18/20   [provider]  ezetimibe (ZETIA) 10 MG tablet Take 10 mg by mouth daily. 08/24/20   [provider]  hydrochlorothiazide (HYDRODIURIL) 25 MG tablet TAKE ONE TABLET (25MG  TOTAL) BY MOUTH EVERY MORNING 02/05/21   Tolia, Sunit, DO  irbesartan (AVAPRO) 150 MG tablet Take 150 mg by mouth at bedtime.  08/06/17   [provider]  potassium chloride (KLOR-CON) 10 MEQ tablet Take 10 mEq by mouth every other day. 09/25/20   [provider]      Allergies    Patient has no known allergies.    Review of Systems   Review of Systems  Constitutional:  Negative for fever.  Respiratory:  Negative for shortness of breath.   Cardiovascular:  Positive for leg swelling. Negative for chest pain.  Neurological:   Negative for weakness and numbness.   Physical Exam Updated Vital Signs BP (!) 177/89 (BP Location: Right Arm)    Pulse 77    Temp 97.9 F (36.6 C) (Oral)    Resp 16    Ht 5\' 1"  (1.549 m)    Wt 58.5 kg    SpO2 98%    BMI 24.37 kg/m  Physical Exam Constitutional:      Appearance: Normal appearance. She is well-developed.  HENT:     Head: Normocephalic and atraumatic.  Eyes:     Conjunctiva/sclera: Conjunctivae normal.  Musculoskeletal:        General: Tenderness present. Normal range of motion.     Cervical back: Neck supple.     Left lower leg: Edema present.     Comments: She has some slight tenderness of her left lower leg just above her ankle.  There is a little bit of erythema although no warmth.  No cords appreciated.  Trace edema.  Distal pulses motor and sensation intact.  Skin:    General: Skin is warm and dry.     Capillary Refill: Capillary refill takes less than 2 seconds.  Neurological:     General: No focal deficit present.     Mental Status: She is alert.     GCS: GCS eye subscore  is 4. GCS verbal subscore is 5. GCS motor subscore is 6.    ED Results / Procedures / Treatments   Labs (all labs ordered are listed, but only abnormal results are displayed) Labs Reviewed - No data to display  EKG None  Radiology US Venous Img Lower Unilateral Left  Result Date: 04/02/2021 CLINICAL DATA:  Edema and tenderness left lower extremity EXAM: Left LOWER EXTREMITY VENOUS DOPPLER ULTRASOUND TECHNIQUE: Gray-scale sonography with compression, as well as color and duplex ultrasound, were performed to evaluate the deep venous system(s) from the level of the common femoral vein through the popliteal and proximal calf veins. COMPARISON:  None. FINDINGS: VENOUS Normal compressibility of the common femoral, superficial femoral, and popliteal veins, as well as the visualized calf veins. Visualized portions of profunda femoral vein and great saphenous vein unremarkable. No filling defects  to suggest DVT on grayscale or color Doppler imaging. Doppler waveforms show normal direction of venous flow, normal respiratory plasticity and response to augmentation. Limited views of the contralateral common femoral vein are unremarkable. OTHER None. Limitations: none IMPRESSION: Negative. Electronically Signed   By: Donavan Foil M.D.   On: 04/02/2021 16:17    Procedures Procedures    Medications Ordered in ED Medications - No data to display  ED Course/ Medical Decision Making/ A&P                           Medical Decision Making Amount and/or Complexity of Data Reviewed ECG/medicine tests: ordered.  Risk Prescription drug management.  76 year old female with atraumatic left leg swelling and tenderness and erythema.  No obvious skin breaks.  No posterior swelling and no cords appreciated.  It did not show any evidence of DVT.  Patient concerned for possible infection so will cover with antibiotics.  Close follow-up with her treating provider.  Return instructions discussed.         Final Clinical Impression(s) / ED Diagnoses Final diagnoses:  Left leg swelling    Rx / DC Orders ED Discharge Orders          Ordered    doxycycline (VIBRAMYCIN) 100 MG capsule  2 times daily        04/02/21 1828              Hayden Rasmussen, MD 04/03/21 1022

## 2021-04-02 NOTE — Telephone Encounter (Signed)
Pt was made aware and verbalized understanding.  

## 2021-04-02 NOTE — Telephone Encounter (Signed)
Lmom for pt to return my call.  

## 2021-04-02 NOTE — Telephone Encounter (Signed)
Please let patient know that I think she should discuss her left leg redness with Dr. Berdine Addison just to be safe. I don't think it is cellulitis. Could be phlebitis but he may decide to consider u/s to be sure no clot. I want her to see him to advise. She should try to see him in the next 24 hours.

## 2021-04-02 NOTE — Discharge Instructions (Signed)
You were seen in the emergency department for some swelling and redness of your left lower leg.  You had an ultrasound that did not show any evidence of a blood clot.  We are starting you on some antibiotics for possible infection.  Please follow-up with your primary care doctor.  Return to the emergency department if any worsening or concerning symptoms.

## 2021-04-02 NOTE — Patient Instructions (Signed)
Colonoscopy to be scheduled. See separate instructions.  Follow up with [facility] Roosevelt Locks, NP) as scheduled. I look forward to see your labs in the near future.  Keep a check on your swelling. If you swelling in the legs worsen, please let me know. If the red area on your left leg worsens, please let me or Dr. Berdine Addison know. You can have him check sooner if you prefer.

## 2021-04-02 NOTE — ED Triage Notes (Signed)
Pt with left leg swelling, burning and itching, tender to touch per pt since last week. Was instructed to come here.

## 2021-04-08 DIAGNOSIS — K754 Autoimmune hepatitis: Secondary | ICD-10-CM | POA: Diagnosis not present

## 2021-04-08 DIAGNOSIS — R609 Edema, unspecified: Secondary | ICD-10-CM | POA: Diagnosis not present

## 2021-04-08 DIAGNOSIS — L03116 Cellulitis of left lower limb: Secondary | ICD-10-CM | POA: Diagnosis not present

## 2021-04-08 DIAGNOSIS — I10 Essential (primary) hypertension: Secondary | ICD-10-CM | POA: Diagnosis not present

## 2021-04-08 DIAGNOSIS — E785 Hyperlipidemia, unspecified: Secondary | ICD-10-CM | POA: Diagnosis not present

## 2021-04-08 DIAGNOSIS — R600 Localized edema: Secondary | ICD-10-CM | POA: Diagnosis not present

## 2021-04-08 DIAGNOSIS — Z6824 Body mass index (BMI) 24.0-24.9, adult: Secondary | ICD-10-CM | POA: Diagnosis not present

## 2021-04-14 DIAGNOSIS — K754 Autoimmune hepatitis: Secondary | ICD-10-CM | POA: Diagnosis not present

## 2021-04-14 DIAGNOSIS — Z79899 Other long term (current) drug therapy: Secondary | ICD-10-CM | POA: Diagnosis not present

## 2021-04-14 DIAGNOSIS — D84821 Immunodeficiency due to drugs: Secondary | ICD-10-CM | POA: Diagnosis not present

## 2021-04-15 DIAGNOSIS — K754 Autoimmune hepatitis: Secondary | ICD-10-CM | POA: Diagnosis not present

## 2021-04-16 ENCOUNTER — Ambulatory Visit (HOSPITAL_COMMUNITY)
Admission: RE | Admit: 2021-04-16 | Discharge: 2021-04-16 | Disposition: A | Discharge status: Home / Self Care | Payer: Medicare Other | Source: Ambulatory Visit | Attending: Family Medicine | Admitting: Family Medicine

## 2021-04-16 ENCOUNTER — Other Ambulatory Visit: Payer: Self-pay

## 2021-04-16 DIAGNOSIS — Z1231 Encounter for screening mammogram for malignant neoplasm of breast: Secondary | ICD-10-CM | POA: Diagnosis not present

## 2021-04-22 ENCOUNTER — Other Ambulatory Visit (HOSPITAL_COMMUNITY)
Admission: RE | Admit: 2021-04-22 | Discharge: 2021-04-22 | Disposition: A | Discharge status: Home / Self Care | Payer: Medicare Other | Source: Ambulatory Visit | Attending: Internal Medicine | Admitting: Internal Medicine

## 2021-04-22 DIAGNOSIS — I10 Essential (primary) hypertension: Secondary | ICD-10-CM | POA: Diagnosis not present

## 2021-04-22 DIAGNOSIS — Z86010 Personal history of colonic polyps: Secondary | ICD-10-CM | POA: Insufficient documentation

## 2021-04-22 DIAGNOSIS — E7849 Other hyperlipidemia: Secondary | ICD-10-CM | POA: Diagnosis not present

## 2021-04-22 LAB — BASIC METABOLIC PANEL
Anion gap: 8 (ref 5–15)
BUN: 14 mg/dL (ref 8–23)
CO2: 28 mmol/L (ref 22–32)
Calcium: 9 mg/dL (ref 8.9–10.3)
Chloride: 99 mmol/L (ref 98–111)
Creatinine, Ser: 0.88 mg/dL (ref 0.44–1.00)
GFR, Estimated: >60 mL/min (ref >60)
Glucose, Bld: 101 mg/dL — ABNORMAL HIGH (ref 70–99)
Potassium: 3.9 mmol/L (ref 3.5–5.1)
Sodium: 135 mmol/L (ref 135–145)

## 2021-04-30 ENCOUNTER — Encounter (HOSPITAL_COMMUNITY): Payer: Self-pay

## 2021-04-30 ENCOUNTER — Ambulatory Visit (HOSPITAL_BASED_OUTPATIENT_CLINIC_OR_DEPARTMENT_OTHER): Payer: Medicare Other | Admitting: Certified Registered"

## 2021-04-30 ENCOUNTER — Ambulatory Visit (HOSPITAL_COMMUNITY)
Admission: RE | Admit: 2021-04-30 | Discharge: 2021-04-30 | Disposition: A | Discharge status: Home / Self Care | Payer: Medicare Other | Attending: Internal Medicine | Admitting: Internal Medicine

## 2021-04-30 ENCOUNTER — Other Ambulatory Visit: Payer: Self-pay

## 2021-04-30 ENCOUNTER — Ambulatory Visit (HOSPITAL_COMMUNITY): Payer: Medicare Other | Admitting: Certified Registered"

## 2021-04-30 ENCOUNTER — Encounter (HOSPITAL_COMMUNITY)
Admission: RE | Disposition: A | Discharge status: Home / Self Care | Payer: Self-pay | Source: Home / Self Care | Attending: Internal Medicine

## 2021-04-30 DIAGNOSIS — D120 Benign neoplasm of cecum: Secondary | ICD-10-CM | POA: Diagnosis not present

## 2021-04-30 DIAGNOSIS — K635 Polyp of colon: Secondary | ICD-10-CM | POA: Diagnosis not present

## 2021-04-30 DIAGNOSIS — K648 Other hemorrhoids: Secondary | ICD-10-CM | POA: Insufficient documentation

## 2021-04-30 DIAGNOSIS — I10 Essential (primary) hypertension: Secondary | ICD-10-CM | POA: Insufficient documentation

## 2021-04-30 DIAGNOSIS — Z79899 Other long term (current) drug therapy: Secondary | ICD-10-CM | POA: Insufficient documentation

## 2021-04-30 DIAGNOSIS — Q438 Other specified congenital malformations of intestine: Secondary | ICD-10-CM | POA: Insufficient documentation

## 2021-04-30 DIAGNOSIS — R600 Localized edema: Secondary | ICD-10-CM | POA: Insufficient documentation

## 2021-04-30 DIAGNOSIS — K754 Autoimmune hepatitis: Secondary | ICD-10-CM | POA: Diagnosis not present

## 2021-04-30 DIAGNOSIS — Z1211 Encounter for screening for malignant neoplasm of colon: Secondary | ICD-10-CM | POA: Diagnosis not present

## 2021-04-30 DIAGNOSIS — Z09 Encounter for follow-up examination after completed treatment for conditions other than malignant neoplasm: Secondary | ICD-10-CM | POA: Diagnosis present

## 2021-04-30 DIAGNOSIS — Z86010 Personal history of colonic polyps: Secondary | ICD-10-CM | POA: Diagnosis not present

## 2021-04-30 HISTORY — PX: POLYPECTOMY: SHX149

## 2021-04-30 HISTORY — PX: COLONOSCOPY WITH PROPOFOL: SHX5780

## 2021-04-30 SURGERY — COLONOSCOPY WITH PROPOFOL
Anesthesia: General

## 2021-04-30 MED ORDER — LACTATED RINGERS IV SOLN: INTRAVENOUS | Status: DC

## 2021-04-30 MED ORDER — LIDOCAINE HCL (CARDIAC) PF 100 MG/5ML IV SOSY
PREFILLED_SYRINGE | INTRAVENOUS | Status: DC | PRN
  Administered 2021-04-30: 50 mg via INTRAVENOUS

## 2021-04-30 MED ORDER — PROPOFOL 10 MG/ML IV BOLUS
INTRAVENOUS | Status: DC | PRN
  Administered 2021-04-30 (×2): 50 mg via INTRAVENOUS
  Administered 2021-04-30: 100 mg via INTRAVENOUS

## 2021-04-30 NOTE — Interval H&P Note (Signed)
History and Physical Interval Note: ? ?04/30/2021 ?7:46 AM ? ?[name]  has presented today for surgery, with the diagnosis of hx colon polyps.  The various methods of treatment have been discussed with the patient and family. After consideration of risks, benefits and other options for treatment, the patient has consented to  Procedure(s) with comments: ?COLONOSCOPY WITH PROPOFOL (N/A) - 7:30am as a surgical intervention.  The patient's history has been reviewed, patient examined, no change in status, stable for surgery.  I have reviewed the patient's chart and labs.  Questions were answered to the patient's satisfaction.   ? ? ?[name] ? ? ?

## 2021-04-30 NOTE — Discharge Instructions (Addendum)
?  Colonoscopy Discharge Instructions  Read the instructions outlined below and refer to this sheet in the next few weeks. These discharge instructions provide you with general information on caring for yourself after you leave the hospital. Your doctor may also give you specific instructions. While your treatment has been planned according to the most current medical practices available, unavoidable complications occasionally occur.   ACTIVITY You may resume your regular activity, but move at a slower pace for the next 24 hours.  Take frequent rest periods for the next 24 hours.  Walking will help get rid of the air and reduce the bloated feeling in your belly (abdomen).  No driving for 24 hours (because of the medicine (anesthesia) used during the test).   Do not sign any important legal documents or operate any machinery for 24 hours (because of the anesthesia used during the test).  NUTRITION Drink plenty of fluids.  You may resume your normal diet as instructed by your doctor.  Begin with a light meal and progress to your normal diet. Heavy or fried foods are harder to digest and may make you feel sick to your stomach (nauseated).  Avoid alcoholic beverages for 24 hours or as instructed.  MEDICATIONS You may resume your normal medications unless your doctor tells you otherwise.  WHAT YOU CAN EXPECT TODAY Some feelings of bloating in the abdomen.  Passage of more gas than usual.  Spotting of blood in your stool or on the toilet paper.  IF YOU HAD POLYPS REMOVED DURING THE COLONOSCOPY: No aspirin products for 7 days or as instructed.  No alcohol for 7 days or as instructed.  Eat a soft diet for the next 24 hours.  FINDING OUT THE RESULTS OF YOUR TEST Not all test results are available during your visit. If your test results are not back during the visit, make an appointment with your caregiver to find out the results. Do not assume everything is normal if you have not heard from your  caregiver or the medical facility. It is important for you to follow up on all of your test results.  SEEK IMMEDIATE MEDICAL ATTENTION IF: You have more than a spotting of blood in your stool.  Your belly is swollen (abdominal distention).  You are nauseated or vomiting.  You have a temperature over 101.  You have abdominal pain or discomfort that is severe or gets worse throughout the day.   Your colonoscopy revealed 1 polyp(s) which I removed successfully. Await pathology results, my office will contact you. I recommend repeating colonoscopy in 5 years for surveillance purposes. Otherwise follow up with GI as needed.    I hope you have a great rest of your week!  Charles K. Carver, D.O. Gastroenterology and Hepatology [facility]  

## 2021-04-30 NOTE — Anesthesia Preprocedure Evaluation (Signed)
Anesthesia Evaluation  ?Patient identified by MRN, date of birth, ID band ?Patient awake ? ? ? ?Reviewed: ?Allergy & Precautions, H&P , NPO status , Patient's Chart, lab work & pertinent test results, reviewed documented beta blocker date and time  ? ?Airway ?Mallampati: II ? ?TM Distance: >3 FB ?Neck ROM: full ? ? ? Dental ?no notable dental hx. ? ?  ?Pulmonary ?neg pulmonary ROS,  ?  ?Pulmonary exam normal ?breath sounds clear to auscultation ? ? ? ? ? ? Cardiovascular ?Exercise Tolerance: Good ?hypertension, negative cardio ROS ? ? ?Rhythm:regular Rate:Normal ? ? ?  ?Neuro/Psych ?negative neurological ROS ? negative psych ROS  ? GI/Hepatic ?negative GI ROS, (+) Hepatitis -  ?Endo/Other  ?negative endocrine ROS ? Renal/GU ?negative Renal ROS  ?negative genitourinary ?  ?Musculoskeletal ? ? Abdominal ?  ?Peds ? Hematology ?negative hematology ROS ?(+)   ?Anesthesia Other Findings ?Echocardiogram 11/19/2020:  ?Left ventricle cavity is normal in size. Mild concentric hypertrophy of  ?the left ventricle. Normal global wall motion. Normal LV systolic function  ?with EF 60%. Normal diastolic filling pattern.  ?Structurally normal trileaflet aortic valve. ?Trace aortic regurgitation.  ?Mild to moderate mitral regurgitation.  ?Normal right atrial pressure. ? Reproductive/Obstetrics ?negative OB ROS ? ?  ? ? ? ? ? ? ? ? ? ? ? ? ? ?  ?  ? ? ? ? ? ? ? ? ?Anesthesia Physical ?Anesthesia Plan ? ?ASA: 2 ? ?Anesthesia Plan: General  ? ?Post-op Pain Management:   ? ?Induction:  ? ?PONV Risk Score and Plan: Propofol infusion ? ?Airway Management Planned:  ? ?Additional Equipment:  ? ?Intra-op Plan:  ? ?Post-operative Plan:  ? ?Informed Consent: I have reviewed the patients History and Physical, chart, labs and discussed the procedure including the risks, benefits and alternatives for the proposed anesthesia with the patient or authorized representative who has indicated his/her understanding and  acceptance.  ? ? ? ?Dental Advisory Given ? ?Plan Discussed with: CRNA ? ?Anesthesia Plan Comments:   ? ? ? ? ? ? ?Anesthesia Quick Evaluation ? ?

## 2021-04-30 NOTE — Anesthesia Postprocedure Evaluation (Signed)
Anesthesia Post Note ? ?Patient: CHEYAN FREES ? ?Procedure(s) Performed: COLONOSCOPY WITH PROPOFOL ?POLYPECTOMY INTESTINAL ? ?Patient location during evaluation: Phase II ?Anesthesia Type: General ?Level of consciousness: awake ?Pain management: pain level controlled ?Vital Signs Assessment: post-procedure vital signs reviewed and stable ?Respiratory status: spontaneous breathing and respiratory function stable ?Cardiovascular status: blood pressure returned to baseline and stable ?Postop Assessment: no headache and no apparent nausea or vomiting ?Anesthetic complications: no ?Comments: Late entry ? ? ?No notable events documented. ? ? ?Last Vitals:  ?Vitals:  ? 04/30/21 0651 04/30/21 0814  ?BP: (!) 157/66 (!) 120/52  ?Pulse: 77   ?Resp: 11 (!) 24  ?Temp: 36.8 ?C 36.8 ?C  ?SpO2: 99% 98%  ?  ?Last Pain:  ?Vitals:  ? 04/30/21 0833  ?TempSrc:   ?PainSc: 6   ? ? ?  ?  ?  ?  ?  ?  ? ?Louann Sjogren ? ? ? ? ?

## 2021-04-30 NOTE — Transfer of Care (Signed)
Immediate Anesthesia Transfer of Care Note ? ?Patient: [name] ? ?Procedure(s) Performed: COLONOSCOPY WITH PROPOFOL ?POLYPECTOMY INTESTINAL ? ?Patient Location: Endoscopy Unit ? ?Anesthesia Type:General ? ?Level of Consciousness: awake ? ?Airway & Oxygen Therapy: Patient Spontanous Breathing ? ?Post-op Assessment: Report given to RN and Post -op Vital signs reviewed and stable ? ?Post vital signs: Reviewed and stable ? ?Last Vitals:  ?Vitals Value Taken Time  ?BP    ?Temp    ?Pulse    ?Resp    ?SpO2    ? ? ?Last Pain:  ?Vitals:  ? 04/30/21 0749  ?TempSrc:   ?PainSc: 0-No pain  ?   ? ?  ? ?Complications: No notable events documented. ?

## 2021-04-30 NOTE — Anesthesia Procedure Notes (Signed)
Date/Time: 04/30/2021 7:55 AM ?Performed by: Orlie Dakin, CRNA ?Pre-anesthesia Checklist: Patient identified, Emergency Drugs available, Suction available and Patient being monitored ?Patient Re-evaluated:Patient Re-evaluated prior to induction ?Oxygen Delivery Method: Nasal cannula ?Induction Type: IV induction ?Placement Confirmation: positive ETCO2 ? ? ? ? ?

## 2021-04-30 NOTE — Op Note (Signed)
[facility] ?Patient Name: [name] ?Procedure Date: 04/30/2021 7:13 AM ?MRN: [id number] ?Date of Birth: [birth year] ?Attending MD: Elon Alas. Abbey Chatters , DO ?CSN: [id number] ?Age: 75 ?Admit Type: Outpatient ?Procedure:                Colonoscopy ?Indications:              Surveillance: Personal history of adenomatous  ?                          polyps on last colonoscopy > 3 years ago ?Providers:                Elon Alas. Abbey Chatters, DO, Crystal Page, [place]  ?                          Wynonia Lawman, Technician ?Referring MD:              ?Medicines:                See the Anesthesia note for documentation of the  ?                          administered medications ?Complications:            No immediate complications. ?Estimated Blood Loss:     Estimated blood loss was minimal. ?Procedure:                Pre-Anesthesia Assessment: ?                          - The anesthesia plan was to use monitored  ?                          anesthesia care (MAC). ?                          After obtaining informed consent, the colonoscope  ?                          was passed under direct vision. Throughout the  ?                          procedure, the patient's blood pressure, pulse, and  ?                          oxygen saturations were monitored continuously. The  ?                          [id number]-HQ190L ([id number]) scope was introduced through  ?                          the anus and advanced to the the cecum, identified  ?                          by appendiceal orifice and ileocecal valve. The  ?                          colonoscopy was performed without difficulty.  The  ?                          patient tolerated the procedure well. The quality  ?                          of the bowel preparation was evaluated using the  ?                          BBPS [facility] Bowel Preparation Scale) with scores  ?                          of: Right Colon = 3, Transverse Colon = 3 and Left  ?                          Colon = 3 (entire mucosa  seen well with no residual  ?                          staining, small fragments of stool or opaque  ?                          liquid). The total BBPS score equals 9. ?Scope In: 7:53:25 AM ?Scope Out: 8:11:25 AM ?Scope Withdrawal Time: 0 hours 8 minutes 57 seconds  ?Total Procedure Duration: 0 hours 18 minutes 0 seconds  ?Findings: ?     The perianal and digital rectal examinations were normal. ?     Non-bleeding internal hemorrhoids were found during endoscopy. ?     The sigmoid colon and descending colon were significantly tortuous. ?     A 2 mm polyp was found in the cecum. The polyp was sessile. The polyp  ?     was removed with a cold biopsy forceps. Resection and retrieval were  ?     complete. ?Impression:               - Non-bleeding internal hemorrhoids. ?                          - Tortuous colon. ?                          - One 2 mm polyp in the cecum, removed with a cold  ?                          biopsy forceps. Resected and retrieved. ?Moderate Sedation: ?     Per Anesthesia Care ?Recommendation:           - Patient has a contact number available for  ?                          emergencies. The signs and symptoms of potential  ?                          delayed complications were discussed with the  ?  patient. Return to normal activities tomorrow.  ?                          Written discharge instructions were provided to the  ?                          patient. ?                          - Resume previous diet. ?                          - Continue present medications. ?                          - Await pathology results. ?                          - Repeat colonoscopy in 5 years for surveillance. ?                          - Return to GI clinic PRN. ?Procedure Code(s):        --- Professional --- ?                          [phone], Colonoscopy, flexible; with biopsy, single  ?                          or multiple ?Diagnosis Code(s):        --- Professional --- ?                           Z86.010, Personal history of colonic polyps ?                          K63.5, Polyp of colon ?                          K64.8, Other hemorrhoids ?                          Q43.8, Other specified congenital malformations of  ?                          intestine ?CPT copyright 2019 American Medical Association. All rights reserved. ?The codes documented in this report are preliminary and upon coder review may  ?be revised to meet current compliance requirements. ?Elon Alas. Abbey Chatters, DO ?Elon Alas. [place], DO ?04/30/2021 8:16:38 AM ?This report has been signed electronically. ?Number of Addenda: 0 ?

## 2021-05-01 ENCOUNTER — Other Ambulatory Visit: Payer: Self-pay | Admitting: Cardiology

## 2021-05-01 DIAGNOSIS — M7989 Other specified soft tissue disorders: Secondary | ICD-10-CM

## 2021-05-01 DIAGNOSIS — N183 Chronic kidney disease, stage 3 unspecified: Secondary | ICD-10-CM

## 2021-05-01 LAB — SURGICAL PATHOLOGY

## 2021-05-05 ENCOUNTER — Encounter (HOSPITAL_COMMUNITY): Payer: Self-pay | Admitting: Internal Medicine

## 2021-07-01 DIAGNOSIS — K754 Autoimmune hepatitis: Secondary | ICD-10-CM | POA: Diagnosis not present

## 2021-07-01 DIAGNOSIS — Z0000 Encounter for general adult medical examination without abnormal findings: Secondary | ICD-10-CM | POA: Diagnosis not present

## 2021-07-01 DIAGNOSIS — Z6824 Body mass index (BMI) 24.0-24.9, adult: Secondary | ICD-10-CM | POA: Diagnosis not present

## 2021-07-01 DIAGNOSIS — E785 Hyperlipidemia, unspecified: Secondary | ICD-10-CM | POA: Diagnosis not present

## 2021-07-01 DIAGNOSIS — I10 Essential (primary) hypertension: Secondary | ICD-10-CM | POA: Diagnosis not present

## 2021-08-14 ENCOUNTER — Other Ambulatory Visit: Payer: Self-pay | Admitting: Cardiology

## 2021-08-14 DIAGNOSIS — M7989 Other specified soft tissue disorders: Secondary | ICD-10-CM

## 2021-08-14 DIAGNOSIS — I129 Hypertensive chronic kidney disease with stage 1 through stage 4 chronic kidney disease, or unspecified chronic kidney disease: Secondary | ICD-10-CM

## 2021-09-23 DIAGNOSIS — Z6825 Body mass index (BMI) 25.0-25.9, adult: Secondary | ICD-10-CM | POA: Diagnosis not present

## 2021-09-23 DIAGNOSIS — I10 Essential (primary) hypertension: Secondary | ICD-10-CM | POA: Diagnosis not present

## 2021-09-23 DIAGNOSIS — K754 Autoimmune hepatitis: Secondary | ICD-10-CM | POA: Diagnosis not present

## 2021-09-23 DIAGNOSIS — E785 Hyperlipidemia, unspecified: Secondary | ICD-10-CM | POA: Diagnosis not present

## 2021-10-15 DIAGNOSIS — K754 Autoimmune hepatitis: Secondary | ICD-10-CM | POA: Diagnosis not present

## 2021-10-15 DIAGNOSIS — Z79899 Other long term (current) drug therapy: Secondary | ICD-10-CM | POA: Diagnosis not present

## 2021-10-15 DIAGNOSIS — D84821 Immunodeficiency due to drugs: Secondary | ICD-10-CM | POA: Diagnosis not present

## 2021-12-03 DIAGNOSIS — D84821 Immunodeficiency due to drugs: Secondary | ICD-10-CM | POA: Diagnosis not present

## 2021-12-03 DIAGNOSIS — Z79899 Other long term (current) drug therapy: Secondary | ICD-10-CM | POA: Diagnosis not present

## 2021-12-03 DIAGNOSIS — K754 Autoimmune hepatitis: Secondary | ICD-10-CM | POA: Diagnosis not present

## 2021-12-04 ENCOUNTER — Other Ambulatory Visit: Payer: Self-pay | Admitting: Cardiology

## 2021-12-04 DIAGNOSIS — N183 Chronic kidney disease, stage 3 unspecified: Secondary | ICD-10-CM

## 2021-12-04 DIAGNOSIS — M7989 Other specified soft tissue disorders: Secondary | ICD-10-CM

## 2021-12-05 ENCOUNTER — Other Ambulatory Visit: Payer: Self-pay

## 2021-12-05 DIAGNOSIS — M7989 Other specified soft tissue disorders: Secondary | ICD-10-CM

## 2021-12-05 DIAGNOSIS — I129 Hypertensive chronic kidney disease with stage 1 through stage 4 chronic kidney disease, or unspecified chronic kidney disease: Secondary | ICD-10-CM

## 2021-12-05 MED ORDER — HYDROCHLOROTHIAZIDE 25 MG PO TABS: ORAL_TABLET | ORAL | 0 refills | Status: DC

## 2021-12-17 ENCOUNTER — Ambulatory Visit: Payer: Medicare Other | Admitting: Cardiology

## 2021-12-17 ENCOUNTER — Encounter: Payer: Self-pay | Admitting: Cardiology

## 2021-12-17 VITALS — BP 120/60 | HR 74 | Resp 16 | Ht 61.0 in | Wt 134.2 lb

## 2021-12-17 DIAGNOSIS — M7989 Other specified soft tissue disorders: Secondary | ICD-10-CM

## 2021-12-17 DIAGNOSIS — N183 Chronic kidney disease, stage 3 unspecified: Secondary | ICD-10-CM | POA: Diagnosis not present

## 2021-12-17 DIAGNOSIS — E782 Mixed hyperlipidemia: Secondary | ICD-10-CM

## 2021-12-17 DIAGNOSIS — I129 Hypertensive chronic kidney disease with stage 1 through stage 4 chronic kidney disease, or unspecified chronic kidney disease: Secondary | ICD-10-CM | POA: Diagnosis not present

## 2021-12-17 NOTE — Progress Notes (Signed)
Date:  12/17/2021   ID:  [name], DOB 1946-01-05, MRN [id number]  PCP:  Iona Beard, MD  Cardiologist:  Rex Kras, DO, [facility] (established care 10/29/2020)  Date: 12/17/21 Last Office Visit: 10/29/2020  Chief Complaint  Patient presents with   Hypertension   Leg Swelling   Follow-up    HPI  [name] is a 76 y.o. female whose past medical history and cardiovascular risk factors include: Autoimmune hepatitis, hypertension, hyperlipidemia, advanced age, postmenopausal female.  Patient was referred to the practice for bilateral lower extremity swelling.  At the last office visit she was recommended to discontinue amlodipine and start hydrochlorothiazide as an alternative for blood pressure management.  After stopping amlodipine her swelling has resolved and her blood pressures are well controlled on the current dose of HCTZ.  She presents for 1 year follow-up.  Patient denies any anginal discomfort or heart failure symptoms.  Her lower extremity swelling has not resurfaced.  FUNCTIONAL STATUS: Walks 5 miles per day.  ALLERGIES: No Known Allergies  MEDICATION LIST PRIOR TO VISIT: Current Meds  Medication Sig   budesonide (ENTOCORT EC) 3 MG 24 hr capsule Take 9 mg by mouth daily.   ezetimibe (ZETIA) 10 MG tablet Take 10 mg by mouth daily.   hydrochlorothiazide (HYDRODIURIL) 25 MG tablet TAKE ONE TABLET ('25MG'$  TOTAL) BY MOUTH EVERY MORNING   irbesartan (AVAPRO) 150 MG tablet Take 150 mg by mouth at bedtime.    potassium chloride (KLOR-CON) 10 MEQ tablet Take 10 mEq by mouth every other day.     PAST MEDICAL HISTORY: Past Medical History:  Diagnosis Date   Autoimmune hepatitis ([place])    High cholesterol    Hypertension     PAST SURGICAL HISTORY: Past Surgical History:  Procedure Laterality Date   COLONOSCOPY N/A 01/28/2018   Procedure: COLONOSCOPY;  Surgeon: Danie Binder, MD;  [facility];  Service: Endoscopy;  Laterality: N/A;  8:30    COLONOSCOPY WITH PROPOFOL N/A 04/30/2021   Procedure: COLONOSCOPY WITH PROPOFOL;  Surgeon: [name], DO;  Location: [facility];  Service: Endoscopy;  Laterality: N/A;  7:30am   LAPAROSCOPIC HYSTERECTOMY     POLYPECTOMY  01/28/2018   Procedure: POLYPECTOMY;  Surgeon: Danie Binder, MD;  Location: [facility];  Service: Endoscopy;;  colon   POLYPECTOMY  04/30/2021   Procedure: POLYPECTOMY INTESTINAL;  Surgeon: [name], DO;  Location: [facility];  Service: Endoscopy;;    FAMILY HISTORY: The patient family history includes Stroke in her mother.  SOCIAL HISTORY:  The patient  reports that she has never smoked. She has never used smokeless tobacco. She reports that she does not drink alcohol and does not use drugs.  REVIEW OF SYSTEMS: Review of Systems  Cardiovascular:  Negative for chest pain, claudication, dyspnea on exertion, irregular heartbeat, leg swelling, near-syncope, orthopnea, palpitations, paroxysmal nocturnal dyspnea and syncope.  Respiratory:  Negative for shortness of breath.   Hematologic/Lymphatic: Negative for bleeding problem.  Musculoskeletal:  Negative for muscle cramps and myalgias.  Neurological:  Negative for dizziness and light-headedness.    PHYSICAL EXAM:    12/17/2021   12:46 PM 04/30/2021    8:14 AM 04/30/2021    6:51 AM  Vitals with BMI  Height '5\' 1"'$   '5\' 1"'$   Weight 134 lbs 3 oz  130 lbs  BMI 25.85  27.78  Systolic 242 353 614  Diastolic 60 52 66  Pulse 74  77   Physical Exam  Constitutional: No distress.  Age appropriate, hemodynamically stable.   Neck: No JVD present.  Cardiovascular: Normal rate, regular rhythm, S1 normal, S2 normal, intact distal pulses and normal pulses. Exam reveals no gallop, no S3 and no S4.  No murmur heard. Pulmonary/Chest: Effort normal and breath sounds normal. No stridor. She has no wheezes. She has no rales.  Abdominal: Soft. Bowel sounds are normal. She exhibits no distension. There is no abdominal  tenderness.  Musculoskeletal:        General: No edema.     Cervical back: Neck supple.  Neurological: She is alert and oriented to person, place, and time. She has intact cranial nerves (2-12).  Skin: Skin is warm and moist.   CARDIAC DATABASE: EKG: 10/29/2020: Normal sinus rhythm, 76 bpm, without underlying ischemia or injury pattern. 12/17/2021: Normal sinus rhythm, 71 bpm, without underlying injury pattern.   Echocardiogram: No results found for this or any previous visit from the past 1095 days.   Stress Testing: No results found for this or any previous visit from the past 1095 days.  Heart Catheterization: None  LABORATORY DATA:    Latest Ref Rng & Units 05/29/2020    6:33 AM 12/24/2013    9:14 PM 12/12/2013   10:36 AM  CBC  WBC 4.0 - 10.5 K/uL 4.6  8.5  6.8   Hemoglobin 12.0 - 15.0 g/dL 12.3  12.2  10.9   Hematocrit 36.0 - 46.0 % 38.8  37.1  32.3   Platelets 150 - 400 K/uL 272  515  249        Latest Ref Rng & Units 04/22/2021   12:08 PM 05/14/2020    2:14 PM 12/24/2013    9:14 PM  CMP  Glucose 70 - 99 mg/dL 101   132   BUN 8 - 23 mg/dL 14   33   Creatinine 0.44 - 1.00 mg/dL 0.88  0.70  1.28   Sodium 135 - 145 mmol/L 135   138   Potassium 3.5 - 5.1 mmol/L 3.9   3.4   Chloride 98 - 111 mmol/L 99   99   CO2 22 - 32 mmol/L 28   22   Calcium 8.9 - 10.3 mg/dL 9.0   9.2   Total Protein 6.0 - 8.3 g/dL   8.1   Total Bilirubin 0.3 - 1.2 mg/dL   1.0   Alkaline Phos 39 - 117 U/L   233   AST 0 - 37 U/L   24   ALT 0 - 35 U/L   20     Lipid Panel     Component Value Date/Time   CHOL (H) 10/29/2008 0305    206        ATP III CLASSIFICATION:  <200     mg/dL   Desirable  200-239  mg/dL   Borderline High  >=240    mg/dL   High          TRIG 78 10/29/2008 0305   HDL 59 10/29/2008 0305   CHOLHDL 3.5 10/29/2008 0305   VLDL 16 10/29/2008 0305   LDLCALC (H) 10/29/2008 0305    131        Total Cholesterol/HDL:CHD Risk Coronary Heart Disease Risk Table                      Men   Women  1/2 Average Risk   3.4   3.3  Average Risk       5.0  4.4  2 X Average Risk   9.6   7.1  3 X Average Risk  23.4   11.0        Use the calculated Patient Ratio above and the CHD Risk Table to determine the patient's CHD Risk.        ATP III CLASSIFICATION (LDL):  <100     mg/dL   Optimal  100-129  mg/dL   Near or Above                    Optimal  130-159  mg/dL   Borderline  160-189  mg/dL   High  >190     mg/dL   Very High    No components found for: "NTPROBNP" No results for input(s): "PROBNP" in the last 8760 hours. No results for input(s): "TSH" in the last 8760 hours.  BMP Recent Labs    04/22/21 1208  NA 135  K 3.9  CL 99  CO2 28  GLUCOSE 101*  BUN 14  CREATININE 0.88  CALCIUM 9.0  GFRNONAA >60    HEMOGLOBIN A1C No results found for: "HGBA1C", "MPG"  IMPRESSION:    ICD-10-CM   1. Benign hypertension with CKD (chronic kidney disease) stage III (HCC)  I12.9 EKG 12-Lead   N18.30     2. Leg swelling  M79.89     3. Mixed hyperlipidemia  E78.2        RECOMMENDATIONS: RANIYA GOLEMBESKI is a 76 y.o. female whose past medical history and cardiac risk factors include: Autoimmune hepatitis, hypertension, hyperlipidemia, advanced age, postmenopausal female.  Patient presents today for 1 year follow-up visit given her history of bilateral lower extremity swelling.  Since discontinuation of amlodipine and transitioning her to hydrochlorothiazide her lower extremity swelling has resolved completely.  Her blood pressures at home are very well controlled.  For the last 1 year she has not had any anginal discomfort or heart failure symptoms.  EKG today also illustrates normal sinus rhythm without underlying injury pattern.  No additional cardiovascular testing warranted at this time.  We did discuss considering an echo and a GXT as a baseline given her age and risk factors; however, since she is asymptomatic she would like to hold off.  I would like to  see her back on a as needed basis sooner if needed.  Patient is agreeable with the plan of care and is thankful.  FINAL MEDICATION LIST END OF ENCOUNTER: No orders of the defined types were placed in this encounter.   Medications Discontinued During This Encounter  Medication Reason   doxycycline (VIBRAMYCIN) 100 MG capsule      Current Outpatient Medications:    budesonide (ENTOCORT EC) 3 MG 24 hr capsule, Take 9 mg by mouth daily., Disp: , Rfl:    ezetimibe (ZETIA) 10 MG tablet, Take 10 mg by mouth daily., Disp: , Rfl:    hydrochlorothiazide (HYDRODIURIL) 25 MG tablet, TAKE ONE TABLET ('25MG'$  TOTAL) BY MOUTH EVERY MORNING, Disp: 30 tablet, Rfl: 0   irbesartan (AVAPRO) 150 MG tablet, Take 150 mg by mouth at bedtime. , Disp: , Rfl:    potassium chloride (KLOR-CON) 10 MEQ tablet, Take 10 mEq by mouth every other day., Disp: , Rfl:   Orders Placed This Encounter  Procedures   EKG 12-Lead    There are no Patient Instructions on file for this visit.   --Continue cardiac medications as reconciled in final medication list. --Return if symptoms worsen or fail to improve. Or sooner  if needed. --Continue follow-up with your primary care physician regarding the management of your other chronic comorbid conditions.  Patient's questions and concerns were addressed to her satisfaction. She voices understanding of the instructions provided during this encounter.   This note was created using a voice recognition software as a result there may be grammatical errors inadvertently enclosed that do not reflect the nature of this encounter. Every attempt is made to correct such errors.  Rex Kras, [place], [facility]  Pager: [phone] Office: [phone]

## 2021-12-18 ENCOUNTER — Ambulatory Visit: Payer: Medicare Other | Admitting: Cardiology

## 2021-12-23 DIAGNOSIS — E785 Hyperlipidemia, unspecified: Secondary | ICD-10-CM | POA: Diagnosis not present

## 2021-12-23 DIAGNOSIS — I10 Essential (primary) hypertension: Secondary | ICD-10-CM | POA: Diagnosis not present

## 2021-12-23 DIAGNOSIS — K754 Autoimmune hepatitis: Secondary | ICD-10-CM | POA: Diagnosis not present

## 2022-01-05 DIAGNOSIS — Z6825 Body mass index (BMI) 25.0-25.9, adult: Secondary | ICD-10-CM | POA: Diagnosis not present

## 2022-01-05 DIAGNOSIS — E663 Overweight: Secondary | ICD-10-CM | POA: Diagnosis not present

## 2022-01-05 DIAGNOSIS — J020 Streptococcal pharyngitis: Secondary | ICD-10-CM | POA: Diagnosis not present

## 2022-01-11 DIAGNOSIS — E663 Overweight: Secondary | ICD-10-CM | POA: Diagnosis not present

## 2022-01-11 DIAGNOSIS — R5383 Other fatigue: Secondary | ICD-10-CM | POA: Diagnosis not present

## 2022-01-11 DIAGNOSIS — Z6825 Body mass index (BMI) 25.0-25.9, adult: Secondary | ICD-10-CM | POA: Diagnosis not present

## 2022-03-24 ENCOUNTER — Other Ambulatory Visit (HOSPITAL_COMMUNITY): Payer: Self-pay | Admitting: Family Medicine

## 2022-03-24 DIAGNOSIS — R140 Abdominal distension (gaseous): Secondary | ICD-10-CM | POA: Diagnosis not present

## 2022-03-24 DIAGNOSIS — E785 Hyperlipidemia, unspecified: Secondary | ICD-10-CM | POA: Diagnosis not present

## 2022-03-24 DIAGNOSIS — E559 Vitamin D deficiency, unspecified: Secondary | ICD-10-CM | POA: Diagnosis not present

## 2022-03-24 DIAGNOSIS — K754 Autoimmune hepatitis: Secondary | ICD-10-CM | POA: Diagnosis not present

## 2022-03-24 DIAGNOSIS — Z1231 Encounter for screening mammogram for malignant neoplasm of breast: Secondary | ICD-10-CM

## 2022-04-20 ENCOUNTER — Ambulatory Visit (HOSPITAL_COMMUNITY)
Admission: RE | Admit: 2022-04-20 | Discharge: 2022-04-20 | Disposition: A | Discharge status: Home / Self Care | Payer: Medicare Other | Source: Ambulatory Visit | Attending: Family Medicine | Admitting: Family Medicine

## 2022-04-20 DIAGNOSIS — Z1231 Encounter for screening mammogram for malignant neoplasm of breast: Secondary | ICD-10-CM | POA: Diagnosis not present

## 2022-04-22 ENCOUNTER — Other Ambulatory Visit: Payer: Self-pay | Admitting: Nurse Practitioner

## 2022-04-22 DIAGNOSIS — K754 Autoimmune hepatitis: Secondary | ICD-10-CM | POA: Diagnosis not present

## 2022-04-22 DIAGNOSIS — D84821 Immunodeficiency due to drugs: Secondary | ICD-10-CM | POA: Diagnosis not present

## 2022-04-22 DIAGNOSIS — Z79899 Other long term (current) drug therapy: Secondary | ICD-10-CM | POA: Diagnosis not present

## 2022-04-28 DIAGNOSIS — H04123 Dry eye syndrome of bilateral lacrimal glands: Secondary | ICD-10-CM | POA: Diagnosis not present

## 2022-05-20 ENCOUNTER — Ambulatory Visit
Admission: RE | Admit: 2022-05-20 | Discharge: 2022-05-20 | Disposition: A | Discharge status: Home / Self Care | Payer: Medicare Other | Source: Ambulatory Visit | Attending: Nurse Practitioner | Admitting: Nurse Practitioner

## 2022-05-20 DIAGNOSIS — K754 Autoimmune hepatitis: Secondary | ICD-10-CM

## 2022-06-11 DIAGNOSIS — H26493 Other secondary cataract, bilateral: Secondary | ICD-10-CM | POA: Diagnosis not present

## 2022-06-11 DIAGNOSIS — H43811 Vitreous degeneration, right eye: Secondary | ICD-10-CM | POA: Diagnosis not present

## 2022-06-11 DIAGNOSIS — H04123 Dry eye syndrome of bilateral lacrimal glands: Secondary | ICD-10-CM | POA: Diagnosis not present

## 2022-06-11 DIAGNOSIS — Z961 Presence of intraocular lens: Secondary | ICD-10-CM | POA: Diagnosis not present

## 2022-06-23 DIAGNOSIS — Z6823 Body mass index (BMI) 23.0-23.9, adult: Secondary | ICD-10-CM | POA: Diagnosis not present

## 2022-06-23 DIAGNOSIS — I10 Essential (primary) hypertension: Secondary | ICD-10-CM | POA: Diagnosis not present

## 2022-06-23 DIAGNOSIS — K754 Autoimmune hepatitis: Secondary | ICD-10-CM | POA: Diagnosis not present

## 2022-06-23 DIAGNOSIS — L658 Other specified nonscarring hair loss: Secondary | ICD-10-CM | POA: Diagnosis not present

## 2022-06-23 DIAGNOSIS — E785 Hyperlipidemia, unspecified: Secondary | ICD-10-CM | POA: Diagnosis not present

## 2022-07-02 DIAGNOSIS — H26492 Other secondary cataract, left eye: Secondary | ICD-10-CM | POA: Diagnosis not present

## 2022-07-24 DIAGNOSIS — L659 Nonscarring hair loss, unspecified: Secondary | ICD-10-CM | POA: Diagnosis not present

## 2022-07-24 DIAGNOSIS — L668 Other cicatricial alopecia: Secondary | ICD-10-CM | POA: Diagnosis not present

## 2022-07-30 ENCOUNTER — Encounter (HOSPITAL_COMMUNITY): Payer: Self-pay

## 2022-07-30 ENCOUNTER — Other Ambulatory Visit: Payer: Self-pay

## 2022-07-30 ENCOUNTER — Emergency Department (HOSPITAL_COMMUNITY): Payer: No Typology Code available for payment source

## 2022-07-30 ENCOUNTER — Emergency Department (HOSPITAL_COMMUNITY)
Admission: EM | Admit: 2022-07-30 | Discharge: 2022-07-30 | Disposition: A | Discharge status: Home / Self Care | Payer: No Typology Code available for payment source | Attending: Emergency Medicine | Admitting: Emergency Medicine

## 2022-07-30 DIAGNOSIS — R519 Headache, unspecified: Secondary | ICD-10-CM | POA: Diagnosis not present

## 2022-07-30 DIAGNOSIS — Z6824 Body mass index (BMI) 24.0-24.9, adult: Secondary | ICD-10-CM | POA: Diagnosis not present

## 2022-07-30 DIAGNOSIS — Y92410 Unspecified street and highway as the place of occurrence of the external cause: Secondary | ICD-10-CM | POA: Insufficient documentation

## 2022-07-30 DIAGNOSIS — M542 Cervicalgia: Secondary | ICD-10-CM | POA: Diagnosis not present

## 2022-07-30 DIAGNOSIS — M62838 Other muscle spasm: Secondary | ICD-10-CM | POA: Diagnosis not present

## 2022-07-30 DIAGNOSIS — R9082 White matter disease, unspecified: Secondary | ICD-10-CM | POA: Diagnosis not present

## 2022-07-30 DIAGNOSIS — Z041 Encounter for examination and observation following transport accident: Secondary | ICD-10-CM | POA: Diagnosis not present

## 2022-07-30 MED ORDER — LIDOCAINE 5 % EX PTCH: 1.0000 | MEDICATED_PATCH | CUTANEOUS | 0 refills | Status: DC

## 2022-07-30 MED ORDER — METHOCARBAMOL 500 MG PO TABS: 500.0000 mg | ORAL_TABLET | Freq: Two times a day (BID) | ORAL | 0 refills | Status: DC | PRN

## 2022-07-30 MED ORDER — IBUPROFEN 400 MG PO TABS
600.0000 mg | ORAL_TABLET | Freq: Once | ORAL | Status: AC
  Administered 2022-07-30: 600 mg via ORAL
  Filled 2022-07-30: qty 2
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
## 2022-07-30 NOTE — Discharge Instructions (Addendum)
The workup today was overall reassuring.  CT imaging did not show signs of brain bleed or acute fracture or dislocation or other injury of the bones in your neck.  I suspect your symptoms are most likely secondary to muscular injury.  Recommend Tylenol/Motrin as needed at home for pain as well as Lidoderm numbing patches.  Expect the pain to get worse over the next 1 to 2 days before begins to get better.  I recommend mild/gentle stretching exercises of the neck to prevent development of stiffness.  Please do not hesitate to return to emergency department if the worrisome signs and symptoms we discussed become apparent.
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
## 2022-08-03 DIAGNOSIS — M25512 Pain in left shoulder: Secondary | ICD-10-CM | POA: Diagnosis not present

## 2022-08-03 DIAGNOSIS — M542 Cervicalgia: Secondary | ICD-10-CM | POA: Diagnosis not present

## 2022-08-03 DIAGNOSIS — M25511 Pain in right shoulder: Secondary | ICD-10-CM | POA: Diagnosis not present

## 2022-08-03 DIAGNOSIS — M545 Low back pain, unspecified: Secondary | ICD-10-CM | POA: Diagnosis not present

## 2022-08-11 DIAGNOSIS — M19011 Primary osteoarthritis, right shoulder: Secondary | ICD-10-CM | POA: Diagnosis not present

## 2022-08-11 DIAGNOSIS — S29012A Strain of muscle and tendon of back wall of thorax, initial encounter: Secondary | ICD-10-CM | POA: Diagnosis not present

## 2022-08-11 DIAGNOSIS — M5412 Radiculopathy, cervical region: Secondary | ICD-10-CM | POA: Diagnosis not present

## 2022-08-11 DIAGNOSIS — M5030 Other cervical disc degeneration, unspecified cervical region: Secondary | ICD-10-CM | POA: Diagnosis not present

## 2022-08-18 DIAGNOSIS — M25512 Pain in left shoulder: Secondary | ICD-10-CM | POA: Diagnosis not present

## 2022-08-18 DIAGNOSIS — I10 Essential (primary) hypertension: Secondary | ICD-10-CM | POA: Diagnosis not present

## 2022-08-18 DIAGNOSIS — M25511 Pain in right shoulder: Secondary | ICD-10-CM | POA: Diagnosis not present

## 2022-08-18 DIAGNOSIS — M542 Cervicalgia: Secondary | ICD-10-CM | POA: Diagnosis not present

## 2022-08-18 DIAGNOSIS — M545 Low back pain, unspecified: Secondary | ICD-10-CM | POA: Diagnosis not present

## 2022-09-04 DIAGNOSIS — M5412 Radiculopathy, cervical region: Secondary | ICD-10-CM | POA: Diagnosis not present

## 2022-09-04 DIAGNOSIS — M7918 Myalgia, other site: Secondary | ICD-10-CM | POA: Diagnosis not present

## 2022-09-04 DIAGNOSIS — M19011 Primary osteoarthritis, right shoulder: Secondary | ICD-10-CM | POA: Diagnosis not present
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
## 2022-09-09 ENCOUNTER — Ambulatory Visit (HOSPITAL_COMMUNITY): Payer: No Typology Code available for payment source | Attending: Family Medicine | Admitting: Physical Therapy

## 2022-09-09 ENCOUNTER — Other Ambulatory Visit: Payer: Self-pay

## 2022-09-09 DIAGNOSIS — M542 Cervicalgia: Secondary | ICD-10-CM | POA: Diagnosis not present

## 2022-09-14 ENCOUNTER — Ambulatory Visit (HOSPITAL_COMMUNITY): Payer: No Typology Code available for payment source

## 2022-09-14 DIAGNOSIS — M545 Low back pain, unspecified: Secondary | ICD-10-CM | POA: Diagnosis not present

## 2022-09-14 DIAGNOSIS — M542 Cervicalgia: Secondary | ICD-10-CM | POA: Diagnosis not present

## 2022-09-14 DIAGNOSIS — M25511 Pain in right shoulder: Secondary | ICD-10-CM | POA: Diagnosis not present

## 2022-09-14 DIAGNOSIS — M25512 Pain in left shoulder: Secondary | ICD-10-CM | POA: Diagnosis not present
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
## 2022-09-16 ENCOUNTER — Encounter (HOSPITAL_COMMUNITY): Payer: Self-pay

## 2022-09-16 ENCOUNTER — Ambulatory Visit (HOSPITAL_COMMUNITY): Payer: No Typology Code available for payment source

## 2022-09-16 DIAGNOSIS — M542 Cervicalgia: Secondary | ICD-10-CM
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
## 2022-09-21 ENCOUNTER — Ambulatory Visit (HOSPITAL_COMMUNITY): Payer: No Typology Code available for payment source

## 2022-09-21 DIAGNOSIS — M542 Cervicalgia: Secondary | ICD-10-CM | POA: Diagnosis not present
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
## 2022-09-25 ENCOUNTER — Ambulatory Visit (HOSPITAL_COMMUNITY): Payer: Medicare Other | Attending: Family Medicine

## 2022-09-25 ENCOUNTER — Encounter (HOSPITAL_COMMUNITY): Payer: Self-pay

## 2022-09-25 DIAGNOSIS — M542 Cervicalgia: Secondary | ICD-10-CM | POA: Diagnosis not present
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
## 2022-09-26 LAB — AMB RESULTS CONSOLE CBG: Glucose: 123

## 2022-09-26 NOTE — Progress Notes (Signed)
Pt has no SDOH needs at this time. 

## 2022-09-29 ENCOUNTER — Ambulatory Visit (HOSPITAL_COMMUNITY): Payer: Medicare Other | Admitting: Physical Therapy

## 2022-09-29 DIAGNOSIS — M542 Cervicalgia: Secondary | ICD-10-CM
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
## 2022-10-01 ENCOUNTER — Ambulatory Visit (HOSPITAL_COMMUNITY): Payer: Medicare Other

## 2022-10-01 ENCOUNTER — Encounter (HOSPITAL_COMMUNITY): Payer: Self-pay

## 2022-10-01 DIAGNOSIS — M542 Cervicalgia: Secondary | ICD-10-CM
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
## 2022-10-02 DIAGNOSIS — M5412 Radiculopathy, cervical region: Secondary | ICD-10-CM | POA: Diagnosis not present

## 2022-10-02 DIAGNOSIS — M25511 Pain in right shoulder: Secondary | ICD-10-CM | POA: Diagnosis not present

## 2022-10-02 DIAGNOSIS — M7918 Myalgia, other site: Secondary | ICD-10-CM | POA: Diagnosis not present

## 2022-10-05 ENCOUNTER — Encounter (HOSPITAL_COMMUNITY): Payer: Medicare Other | Admitting: Physical Therapy

## 2022-10-07 ENCOUNTER — Encounter: Payer: Self-pay | Admitting: *Deleted

## 2022-10-07 ENCOUNTER — Encounter (HOSPITAL_COMMUNITY): Payer: Medicare Other

## 2022-10-07 DIAGNOSIS — Z79899 Other long term (current) drug therapy: Secondary | ICD-10-CM | POA: Diagnosis not present

## 2022-10-07 DIAGNOSIS — K754 Autoimmune hepatitis: Secondary | ICD-10-CM | POA: Diagnosis not present

## 2022-10-07 DIAGNOSIS — D84821 Immunodeficiency due to drugs: Secondary | ICD-10-CM | POA: Diagnosis not present

## 2022-10-07 NOTE — Progress Notes (Unsigned)
Pt attended 09/26/22 screening event where her b/p was 115/66 and her blood sugar was 123. At the event, the pt confirmed her PCP was Dr. Mirna Mires at the [facility] and did not identify any SDOH insecurities. Since Dr. Loleta Chance does not document in [facility] (only encounter claims are visible), calls placed to verify he continues to serve as the pt's PCP. Since unable to contact pt, D.r Hill's office called and practice phone triage stated pt has been seen "recently" and "is being followed by Dr. Loleta Chance". [facility] notes pt claim visit occurred on 03/24/22 with Dr. Loleta Chance and again on 06/23/22. Chart review also shows documentation that pt has had ongoing specialty healthcare support by [facility] transplant providers over the past 12 months. Pt returned call and confirmed that Dr. Mirna Mires is still her PCP. No additional health equity team support indicated at this time.

## 2022-10-09 ENCOUNTER — Ambulatory Visit (HOSPITAL_COMMUNITY): Payer: Medicare Other

## 2022-10-09 ENCOUNTER — Encounter (HOSPITAL_COMMUNITY): Payer: Self-pay

## 2022-10-09 DIAGNOSIS — M542 Cervicalgia: Secondary | ICD-10-CM | POA: Diagnosis not present
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
## 2022-10-13 NOTE — Addendum Note (Signed)
Addended by: Bella Kennedy on: 10/13/2022 08:31 AM   Modules accepted: Orders

## 2022-10-14 ENCOUNTER — Ambulatory Visit (HOSPITAL_COMMUNITY): Payer: Medicare Other | Admitting: Physical Therapy

## 2022-10-14 DIAGNOSIS — M542 Cervicalgia: Secondary | ICD-10-CM
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
## 2022-10-16 ENCOUNTER — Other Ambulatory Visit: Payer: Self-pay | Admitting: Cardiology

## 2022-10-16 ENCOUNTER — Ambulatory Visit (HOSPITAL_COMMUNITY): Payer: Medicare Other

## 2022-10-16 DIAGNOSIS — M7989 Other specified soft tissue disorders: Secondary | ICD-10-CM

## 2022-10-16 DIAGNOSIS — M542 Cervicalgia: Secondary | ICD-10-CM

## 2022-10-16 DIAGNOSIS — N183 Chronic kidney disease, stage 3 unspecified: Secondary | ICD-10-CM
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
## 2022-10-20 DIAGNOSIS — M545 Low back pain, unspecified: Secondary | ICD-10-CM | POA: Diagnosis not present

## 2022-10-20 DIAGNOSIS — M542 Cervicalgia: Secondary | ICD-10-CM | POA: Diagnosis not present

## 2022-10-20 DIAGNOSIS — M25511 Pain in right shoulder: Secondary | ICD-10-CM | POA: Diagnosis not present

## 2022-10-20 DIAGNOSIS — M25512 Pain in left shoulder: Secondary | ICD-10-CM | POA: Diagnosis not present

## 2022-10-21 ENCOUNTER — Ambulatory Visit (HOSPITAL_COMMUNITY): Payer: Medicare Other

## 2022-10-21 DIAGNOSIS — M542 Cervicalgia: Secondary | ICD-10-CM
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
## 2022-10-23 ENCOUNTER — Encounter (HOSPITAL_COMMUNITY): Payer: Medicare Other

## 2022-10-23 DIAGNOSIS — M791 Myalgia, unspecified site: Secondary | ICD-10-CM | POA: Diagnosis not present

## 2022-10-27 ENCOUNTER — Encounter (HOSPITAL_COMMUNITY): Payer: Medicare Other

## 2022-10-27 ENCOUNTER — Telehealth (HOSPITAL_COMMUNITY): Payer: Self-pay

## 2022-10-27 NOTE — Telephone Encounter (Signed)
Called patient today as she did not show up for her appointment. Left a voicemail and patient was instructed to call the clinic if she has any questions.

## 2022-10-28 ENCOUNTER — Ambulatory Visit (HOSPITAL_COMMUNITY): Payer: Medicare Other | Attending: Family Medicine

## 2022-10-28 ENCOUNTER — Encounter (HOSPITAL_COMMUNITY): Payer: Self-pay

## 2022-10-28 DIAGNOSIS — M542 Cervicalgia: Secondary | ICD-10-CM | POA: Diagnosis not present
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
## 2022-10-30 ENCOUNTER — Ambulatory Visit (HOSPITAL_COMMUNITY): Payer: Medicare Other

## 2022-10-30 DIAGNOSIS — M542 Cervicalgia: Secondary | ICD-10-CM | POA: Diagnosis not present
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
## 2022-11-03 DIAGNOSIS — M542 Cervicalgia: Secondary | ICD-10-CM | POA: Diagnosis not present

## 2022-11-03 DIAGNOSIS — M545 Low back pain, unspecified: Secondary | ICD-10-CM | POA: Diagnosis not present

## 2022-11-03 DIAGNOSIS — M25511 Pain in right shoulder: Secondary | ICD-10-CM | POA: Diagnosis not present

## 2022-11-04 ENCOUNTER — Ambulatory Visit (HOSPITAL_COMMUNITY): Payer: Medicare Other

## 2022-11-04 DIAGNOSIS — M542 Cervicalgia: Secondary | ICD-10-CM
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
## 2022-11-05 ENCOUNTER — Telehealth (HOSPITAL_COMMUNITY): Payer: Self-pay

## 2022-11-05 NOTE — Telephone Encounter (Signed)
Error  [name], LPTA/CLT; Delana Meyer [phone]

## 2022-11-06 ENCOUNTER — Encounter (HOSPITAL_COMMUNITY): Payer: Self-pay

## 2022-11-06 ENCOUNTER — Ambulatory Visit (HOSPITAL_COMMUNITY): Payer: Medicare Other

## 2022-11-06 DIAGNOSIS — M542 Cervicalgia: Secondary | ICD-10-CM
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
## 2022-11-11 ENCOUNTER — Ambulatory Visit (HOSPITAL_COMMUNITY): Payer: Medicare Other

## 2022-11-11 DIAGNOSIS — M542 Cervicalgia: Secondary | ICD-10-CM | POA: Diagnosis not present
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
## 2022-11-13 ENCOUNTER — Ambulatory Visit (HOSPITAL_COMMUNITY): Payer: Medicare Other

## 2022-11-13 DIAGNOSIS — M542 Cervicalgia: Secondary | ICD-10-CM | POA: Diagnosis not present
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
## 2022-11-16 ENCOUNTER — Ambulatory Visit (HOSPITAL_COMMUNITY): Payer: Medicare Other | Admitting: Physical Therapy

## 2022-11-16 DIAGNOSIS — M542 Cervicalgia: Secondary | ICD-10-CM | POA: Diagnosis not present
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
## 2022-11-18 ENCOUNTER — Encounter (HOSPITAL_COMMUNITY): Payer: Self-pay

## 2022-11-18 ENCOUNTER — Ambulatory Visit (HOSPITAL_COMMUNITY): Payer: Medicare Other

## 2022-11-18 DIAGNOSIS — M542 Cervicalgia: Secondary | ICD-10-CM
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
## 2022-11-23 ENCOUNTER — Encounter (HOSPITAL_COMMUNITY): Payer: Medicare Other

## 2022-11-23 DIAGNOSIS — M25511 Pain in right shoulder: Secondary | ICD-10-CM | POA: Diagnosis not present

## 2022-11-23 DIAGNOSIS — M25512 Pain in left shoulder: Secondary | ICD-10-CM | POA: Diagnosis not present

## 2022-11-23 DIAGNOSIS — M542 Cervicalgia: Secondary | ICD-10-CM | POA: Diagnosis not present

## 2022-11-23 DIAGNOSIS — M545 Low back pain, unspecified: Secondary | ICD-10-CM | POA: Diagnosis not present

## 2022-11-25 ENCOUNTER — Encounter (HOSPITAL_COMMUNITY): Payer: Medicare Other

## 2022-11-27 ENCOUNTER — Other Ambulatory Visit: Payer: Self-pay | Admitting: Cardiology

## 2022-11-27 DIAGNOSIS — I129 Hypertensive chronic kidney disease with stage 1 through stage 4 chronic kidney disease, or unspecified chronic kidney disease: Secondary | ICD-10-CM

## 2022-11-27 DIAGNOSIS — M7989 Other specified soft tissue disorders: Secondary | ICD-10-CM

## 2022-11-30 ENCOUNTER — Encounter (HOSPITAL_COMMUNITY): Payer: Medicare Other | Admitting: Physical Therapy

## 2022-12-02 ENCOUNTER — Encounter (HOSPITAL_COMMUNITY): Payer: Medicare Other

## 2022-12-04 ENCOUNTER — Encounter (HOSPITAL_COMMUNITY): Payer: Medicare Other

## 2022-12-16 DIAGNOSIS — I10 Essential (primary) hypertension: Secondary | ICD-10-CM | POA: Diagnosis not present

## 2022-12-16 DIAGNOSIS — E7800 Pure hypercholesterolemia, unspecified: Secondary | ICD-10-CM | POA: Diagnosis not present

## 2022-12-30 IMAGING — CT CT ABDOMEN WO/W CM
3 of 13 series · 12 of 46 positions shown, 18 images · IV contrast (omnipaque)
Comparison: 01/29/2007.

CLINICAL DATA: Elevated liver function tests. Nausea, vomiting and
diarrhea.

EXAM:
CT ABDOMEN WITHOUT AND WITH CONTRAST
TECHNIQUE: Multidetector CT imaging of the abdomen was performed following the
standard protocol before and following the bolus administration of
intravenous contrast.
CONTRAST:  80mL OMNIPAQUE IOHEXOL 300 MG/ML  SOLN

[Series 6: axial arterial · axial · arterial · 0.69mm/px · z∈[-500,-350]mm · 5 of 76 slices shown]
[im 13/76  soft-tissue]
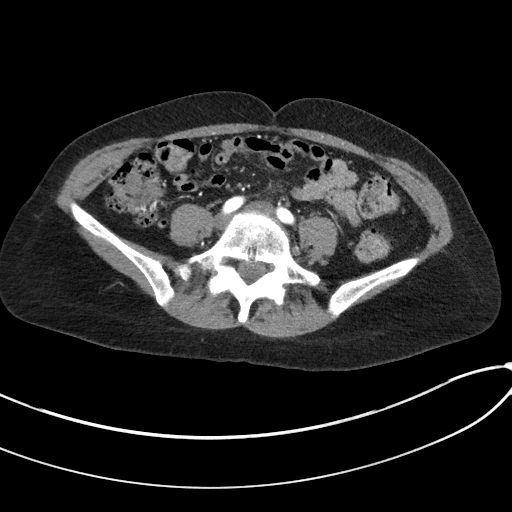
[im 26/76  soft-tissue]
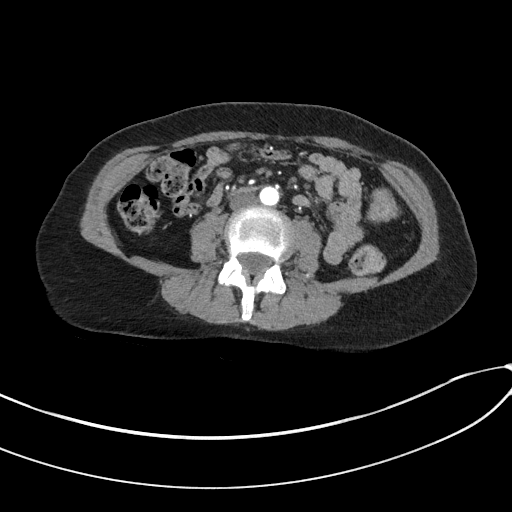
[im 38/76  soft-tissue]
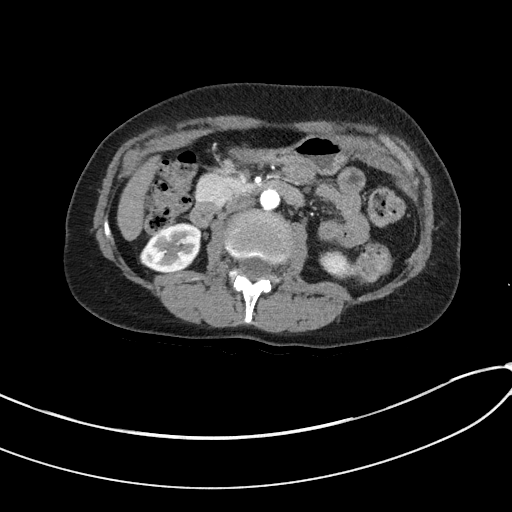
[im 51/76  soft-tissue]
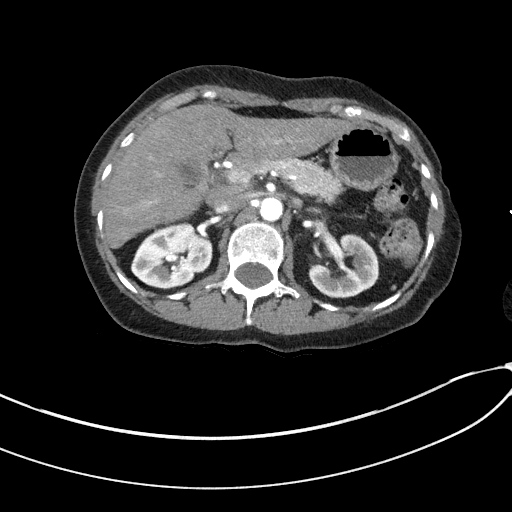
[im 63/76  soft-tissue]
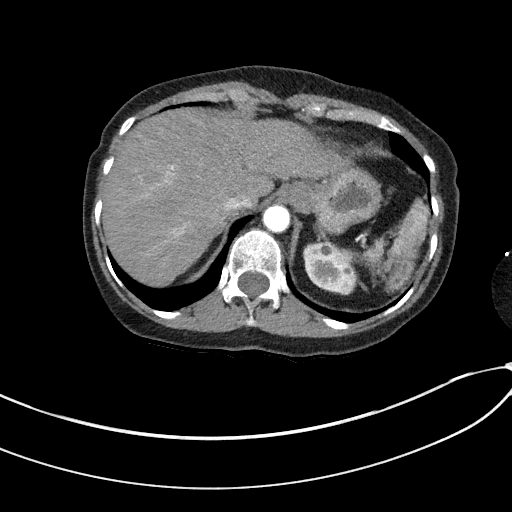

[Series 9: coronal arterial · coronal · arterial · 0.47mm/px · 2 of 76 slices shown, 3 images]
[im 26/76  soft-tissue]
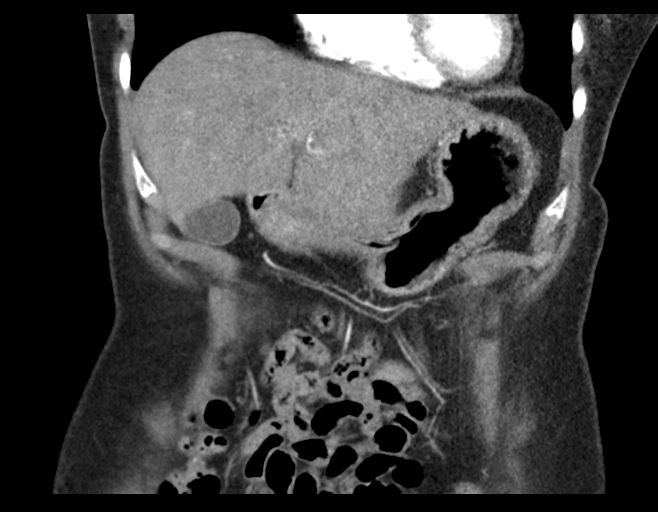
[im 26/76  bone]
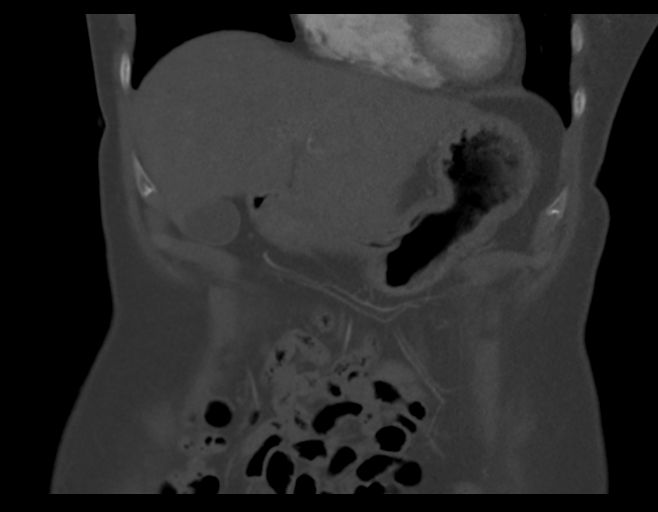
[im 51/76  soft-tissue]
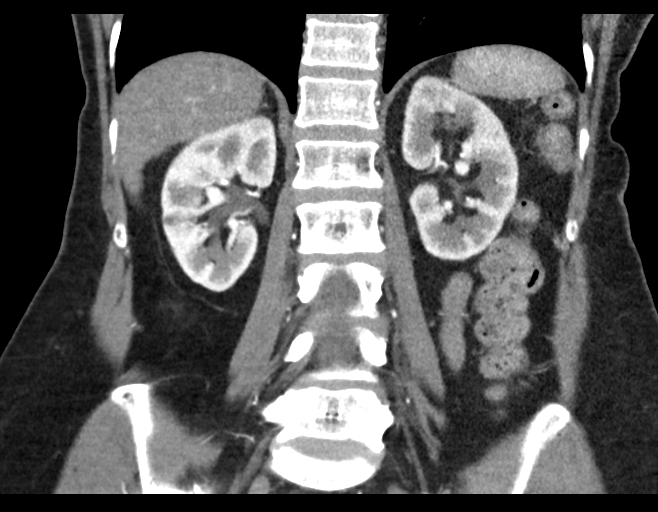

[Series 11: axial venous · axial · portal-venous · 0.69mm/px · z∈[-500,-350]mm · 5 of 76 slices shown, 10 images]
[im 13/76  soft-tissue]
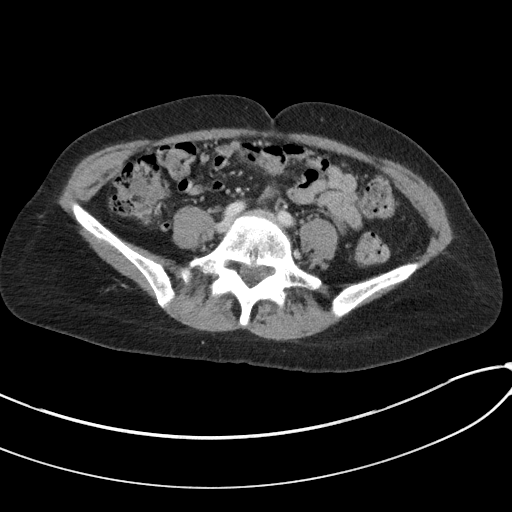
[im 13/76  bone]
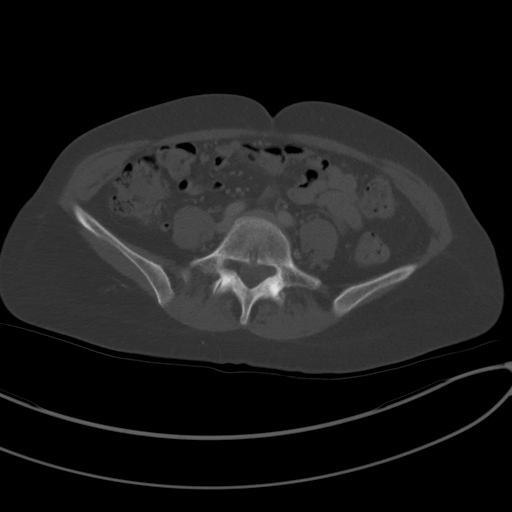
[im 26/76  soft-tissue]
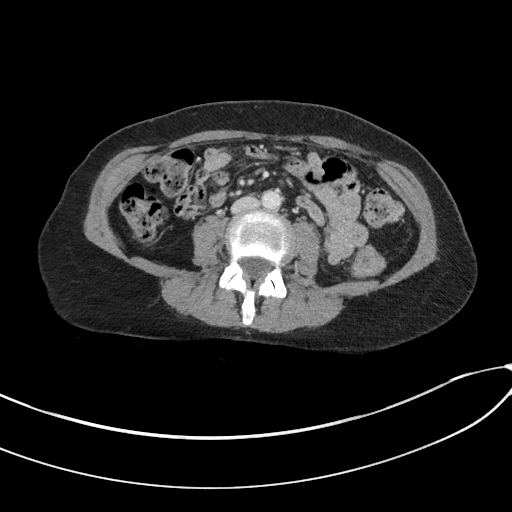
[im 26/76  lung]
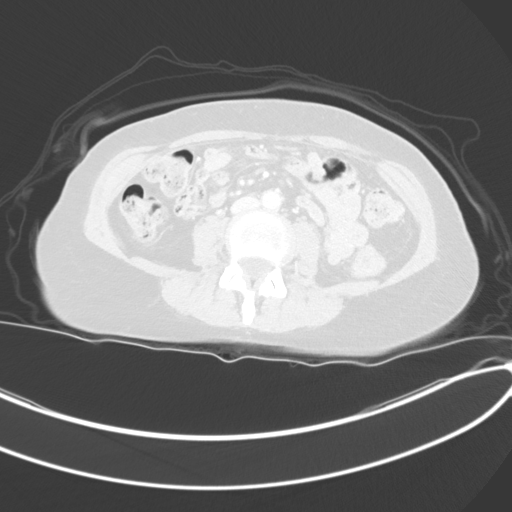
[im 38/76  soft-tissue]
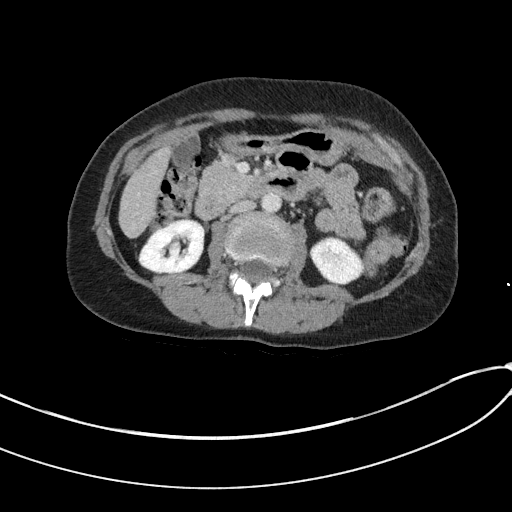
[im 38/76  lung]
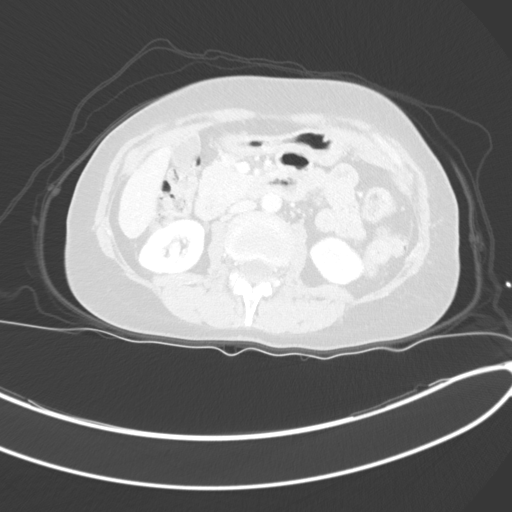
[im 51/76  soft-tissue]
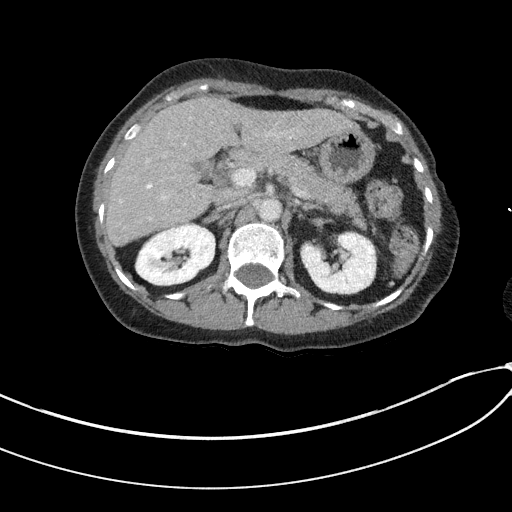
[im 51/76  lung]
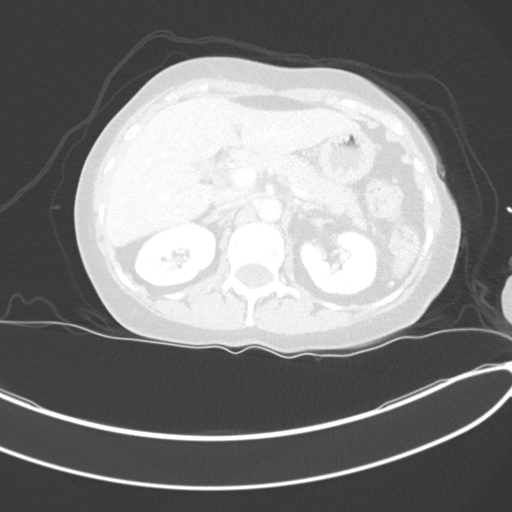
[im 63/76  soft-tissue]
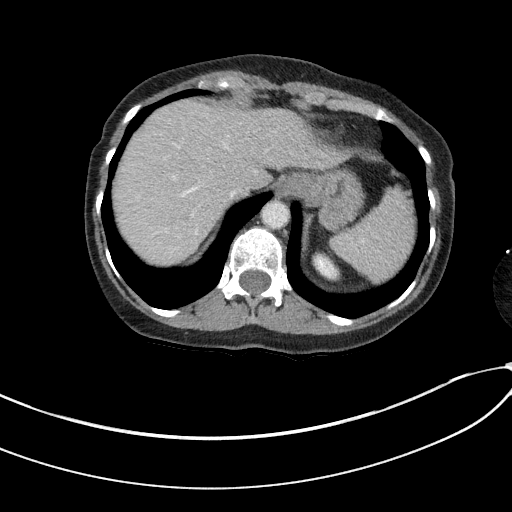
[im 63/76  lung]
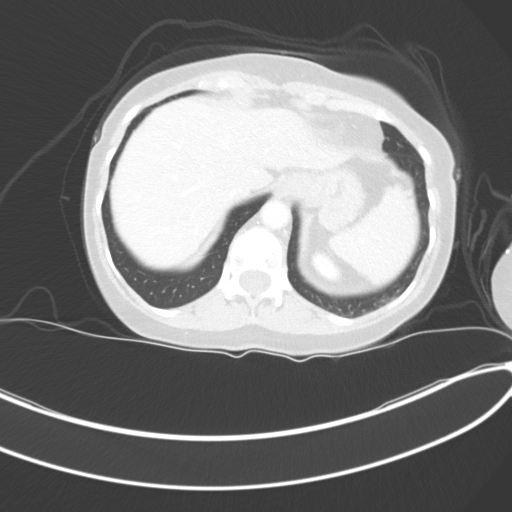

[12 of 46 positions shown; findings below may reference images not displayed]

FINDINGS: Lower chest: Lung bases are clear. Heart size normal. No pericardial
or pleural effusion. Distal esophagus is unremarkable.

Hepatobiliary: 8 mm focal arterial phase enhancement in the right
hepatic lobe ([DATE]) appears less intense on portal venous phase
imaging. Similar blush of hyperattenuation in the right hepatic lobe
on 01/29/2007, findings indicative of a flash fill hemangioma. No
additional areas of abnormal arterial phase enhancement. Liver and
gallbladder are unremarkable. No biliary ductal dilatation.

Pancreas: Negative.

Spleen: Negative.

Adrenals/Urinary Tract: Adrenal glands are unremarkable.
Subcentimeter low-attenuation lesions in the kidneys are too small
to characterize but statistically, cysts are likely. No urinary
stones.

Stomach/Bowel: Stomach and visualized portions of the small bowel,
appendix and colon are unremarkable.

Vascular/Lymphatic: Vascular structures are unremarkable. Scattered
lymph nodes are not enlarged by CT size criteria.

Other: No free fluid.  Mesenteries and peritoneum are unremarkable.

Musculoskeletal: None.
IMPRESSION: No findings to explain the patient's clinical history.

## 2023-01-15 ENCOUNTER — Other Ambulatory Visit: Payer: Self-pay | Admitting: Cardiology

## 2023-01-15 DIAGNOSIS — N183 Chronic kidney disease, stage 3 unspecified: Secondary | ICD-10-CM

## 2023-01-15 DIAGNOSIS — M7989 Other specified soft tissue disorders: Secondary | ICD-10-CM

## 2023-01-25 DIAGNOSIS — E7800 Pure hypercholesterolemia, unspecified: Secondary | ICD-10-CM | POA: Diagnosis not present

## 2023-01-25 DIAGNOSIS — I10 Essential (primary) hypertension: Secondary | ICD-10-CM | POA: Diagnosis not present

## 2023-01-25 DIAGNOSIS — Z0000 Encounter for general adult medical examination without abnormal findings: Secondary | ICD-10-CM | POA: Diagnosis not present

## 2023-01-25 DIAGNOSIS — K754 Autoimmune hepatitis: Secondary | ICD-10-CM | POA: Diagnosis not present

## 2023-02-11 ENCOUNTER — Other Ambulatory Visit: Payer: Self-pay | Admitting: Cardiology

## 2023-02-11 DIAGNOSIS — M7989 Other specified soft tissue disorders: Secondary | ICD-10-CM

## 2023-02-11 DIAGNOSIS — I129 Hypertensive chronic kidney disease with stage 1 through stage 4 chronic kidney disease, or unspecified chronic kidney disease: Secondary | ICD-10-CM

## 2023-03-22 DIAGNOSIS — R5381 Other malaise: Secondary | ICD-10-CM | POA: Diagnosis not present

## 2023-03-23 DIAGNOSIS — Z6823 Body mass index (BMI) 23.0-23.9, adult: Secondary | ICD-10-CM | POA: Diagnosis not present

## 2023-03-23 DIAGNOSIS — K754 Autoimmune hepatitis: Secondary | ICD-10-CM | POA: Diagnosis not present

## 2023-03-23 DIAGNOSIS — I10 Essential (primary) hypertension: Secondary | ICD-10-CM | POA: Diagnosis not present

## 2023-03-23 DIAGNOSIS — R5381 Other malaise: Secondary | ICD-10-CM | POA: Diagnosis not present

## 2023-04-12 ENCOUNTER — Other Ambulatory Visit: Payer: Self-pay | Admitting: Nurse Practitioner

## 2023-04-12 DIAGNOSIS — D84821 Immunodeficiency due to drugs: Secondary | ICD-10-CM | POA: Diagnosis not present

## 2023-04-12 DIAGNOSIS — K754 Autoimmune hepatitis: Secondary | ICD-10-CM | POA: Diagnosis not present

## 2023-04-12 DIAGNOSIS — Z79899 Other long term (current) drug therapy: Secondary | ICD-10-CM | POA: Diagnosis not present

## 2023-04-20 DIAGNOSIS — E782 Mixed hyperlipidemia: Secondary | ICD-10-CM | POA: Diagnosis not present

## 2023-04-20 DIAGNOSIS — K754 Autoimmune hepatitis: Secondary | ICD-10-CM | POA: Diagnosis not present

## 2023-04-20 DIAGNOSIS — I10 Essential (primary) hypertension: Secondary | ICD-10-CM | POA: Diagnosis not present

## 2023-04-22 ENCOUNTER — Other Ambulatory Visit (HOSPITAL_COMMUNITY): Payer: Self-pay | Admitting: Family Medicine

## 2023-04-22 DIAGNOSIS — Z1231 Encounter for screening mammogram for malignant neoplasm of breast: Secondary | ICD-10-CM

## 2023-04-26 ENCOUNTER — Ambulatory Visit (HOSPITAL_COMMUNITY)
Admission: RE | Admit: 2023-04-26 | Discharge: 2023-04-26 | Disposition: A | Discharge status: Home / Self Care | Payer: Medicare Other | Source: Ambulatory Visit | Attending: Family Medicine | Admitting: Family Medicine

## 2023-04-26 DIAGNOSIS — Z1231 Encounter for screening mammogram for malignant neoplasm of breast: Secondary | ICD-10-CM | POA: Diagnosis not present

## 2023-04-27 ENCOUNTER — Ambulatory Visit
Admission: RE | Admit: 2023-04-27 | Discharge: 2023-04-27 | Disposition: A | Discharge status: Home / Self Care | Payer: Medicare Other | Source: Ambulatory Visit | Attending: Nurse Practitioner | Admitting: Nurse Practitioner

## 2023-04-27 DIAGNOSIS — K754 Autoimmune hepatitis: Secondary | ICD-10-CM

## 2023-04-27 DIAGNOSIS — K769 Liver disease, unspecified: Secondary | ICD-10-CM | POA: Diagnosis not present

## 2023-05-20 DIAGNOSIS — M25572 Pain in left ankle and joints of left foot: Secondary | ICD-10-CM | POA: Diagnosis not present

## 2023-07-12 DIAGNOSIS — H43811 Vitreous degeneration, right eye: Secondary | ICD-10-CM | POA: Diagnosis not present

## 2023-07-12 DIAGNOSIS — H04123 Dry eye syndrome of bilateral lacrimal glands: Secondary | ICD-10-CM | POA: Diagnosis not present

## 2023-07-12 DIAGNOSIS — Z961 Presence of intraocular lens: Secondary | ICD-10-CM | POA: Diagnosis not present

## 2023-07-27 DIAGNOSIS — I10 Essential (primary) hypertension: Secondary | ICD-10-CM | POA: Diagnosis not present

## 2023-07-27 DIAGNOSIS — K754 Autoimmune hepatitis: Secondary | ICD-10-CM | POA: Diagnosis not present

## 2023-07-27 DIAGNOSIS — E7800 Pure hypercholesterolemia, unspecified: Secondary | ICD-10-CM | POA: Diagnosis not present

## 2023-08-28 ENCOUNTER — Encounter (HOSPITAL_COMMUNITY): Payer: Self-pay

## 2023-08-28 ENCOUNTER — Ambulatory Visit: Admission: EM | Admit: 2023-08-28 | Discharge: 2023-08-28 | Disposition: A | Discharge status: Home / Self Care

## 2023-08-28 ENCOUNTER — Ambulatory Visit (INDEPENDENT_AMBULATORY_CARE_PROVIDER_SITE_OTHER)

## 2023-08-28 ENCOUNTER — Ambulatory Visit (HOSPITAL_COMMUNITY)
Admission: EM | Admit: 2023-08-28 | Discharge: 2023-08-28 | Disposition: A | Discharge status: Home / Self Care | Attending: Nurse Practitioner | Admitting: Nurse Practitioner

## 2023-08-28 DIAGNOSIS — M25562 Pain in left knee: Secondary | ICD-10-CM | POA: Diagnosis not present

## 2023-08-28 DIAGNOSIS — M25462 Effusion, left knee: Secondary | ICD-10-CM | POA: Diagnosis not present

## 2023-08-28 MED ORDER — IBUPROFEN 800 MG PO TABS: 800.0000 mg | ORAL_TABLET | Freq: Three times a day (TID) | ORAL | 0 refills | Status: AC | PRN

## 2023-08-28 NOTE — ED Triage Notes (Signed)
 Patient here today with c/o left knee pain after falling on it today. Patient hit the side of a bench in a park and tripped. Patient has increased pain with ROM and weightbearing. Patient has taken Tylenol  with some relief.

## 2023-08-28 NOTE — ED Notes (Signed)
 Discussed with patient that we do not have imaging today. Informed pt that we are happy to see her however she would have to go to [facility] for imaging after her visit. Informed her she could check in in the ED if she would like to prevent to separate visits. Patient stated she will not go to [facility]. I provided her with an option to go to another one of our urgent cares. Gave her information on [facility]. Pt verbalized that she will go to [facility] and get same day imaging and results.

## 2023-08-28 NOTE — ED Provider Notes (Signed)
 [facility]    CSN: [id number] Arrival date & time: 08/28/23  1556      History   Chief Complaint Chief Complaint  Patient presents with   Knee Pain    HPI [name] is a 78 y.o. female.   Discussed the use of AI scribe software for clinical note transcription with the patient, who gave verbal consent to proceed.   The patient presents after falling and injuring her [place]. The patient reports hitting the side of a bench, which caused her to trip and fall. The pain is localized to the knee, and the patient notes that the knee has been swollen since the incident. The patient has been able to walk, but experiences pain while doing so. The patient has taken tylenol  prior to arrival stating that she can't take NSAIDs due to having elevated liver enzymes in the past, which were described as autoimmune by a liver doctor. The patient states she doesn't typically take pain medication unless absolutely necessary. She denies having any known kidney issues.  The following portions of the patient's history were reviewed and updated as appropriate: allergies, current medications, past family history, past medical history, past social history, past surgical history, and problem list.    Past Medical History:  Diagnosis Date   Autoimmune hepatitis (HCC)    High cholesterol    Hypertension     Patient Active Problem List   Diagnosis Date Noted   Autoimmune hepatitis (HCC)    H/O adenomatous polyp of colon    Abdominal pain 09/25/2020   Nausea with vomiting 09/25/2020   Special screening for malignant neoplasms, colon     Past Surgical History:  Procedure Laterality Date   COLONOSCOPY N/A 01/28/2018   Procedure: COLONOSCOPY;  Surgeon: Harvey Margo CROME, MD;  Location: [facility];  Service: Endoscopy;  Laterality: N/A;  8:30   COLONOSCOPY WITH PROPOFOL  N/A 04/30/2021   Procedure: COLONOSCOPY WITH PROPOFOL ;  Surgeon: Cindie Carlin POUR, DO;  Location: [facility];  Service: Endoscopy;  Laterality: N/A;  7:30am   LAPAROSCOPIC HYSTERECTOMY     POLYPECTOMY  01/28/2018   Procedure: POLYPECTOMY;  Surgeon: Harvey Margo CROME, MD;  Location: [facility];  Service: Endoscopy;;  colon   POLYPECTOMY  04/30/2021   Procedure: POLYPECTOMY INTESTINAL;  Surgeon: Cindie Carlin POUR, DO;  Location: [facility];  Service: Endoscopy;;    OB History   No obstetric history on file.      Home Medications    Prior to Admission medications   Medication Sig Start Date End Date Taking? Authorizing Provider  ibuprofen  (ADVIL ) 800 MG tablet Take 1 tablet (800 mg total) by mouth every 8 (eight) hours as needed (pain). 08/28/23  Yes Iola Lukes, FNP  budesonide (ENTOCORT EC) 3 MG 24 hr capsule Take 9 mg by mouth daily. 09/18/20   [provider]  hydrochlorothiazide  (HYDRODIURIL ) 25 MG tablet TAKE ONE TABLET (25 MG TOTAL) BY MOUTH EVERY MORNING 01/18/23   Tolia, Sunit, DO  irbesartan (AVAPRO) 150 MG tablet Take 150 mg by mouth at bedtime.  08/06/17   [provider]  potassium chloride (KLOR-CON) 10 MEQ tablet Take 10 mEq by mouth every other day. 09/25/20   [provider]  rosuvastatin (CRESTOR) 5 MG tablet Take 5 mg by mouth daily.    [provider]    Family History Family History  Problem Relation Age of Onset   Stroke Mother  Colon cancer Neg Hx    Colon polyps Neg Hx     Social History Social History   Tobacco Use   Smoking status: Never   Smokeless tobacco: Never  Vaping Use   Vaping status: Never Used  Substance Use Topics   Alcohol use: No   Drug use: No     Allergies   Patient has no known allergies.   Review of Systems Review of Systems  Eyes:  Negative for visual disturbance.  Gastrointestinal:  Negative for nausea and vomiting.  Musculoskeletal:  Negative for back pain and neck pain.       Left knee pain   Skin:  Negative for wound.  Neurological:  Negative  for dizziness, syncope and headaches.  All other systems reviewed and are negative.    Physical Exam Triage Vital Signs ED Triage Vitals  Encounter Vitals Group     BP 08/28/23 1717 (!) 171/79     Girls Systolic BP Percentile --      Girls Diastolic BP Percentile --      Boys Systolic BP Percentile --      Boys Diastolic BP Percentile --      Pulse Rate 08/28/23 1717 72     Resp 08/28/23 1717 16     Temp 08/28/23 1717 98.1 F (36.7 C)     Temp Source 08/28/23 1717 Oral     SpO2 08/28/23 1717 98 %     Weight --      Height --      Head Circumference --      Peak Flow --      Pain Score 08/28/23 1715 7     Pain Loc --      Pain Education --      Exclude from Growth Chart --    No data found.  Updated Vital Signs BP (!) 171/79 (BP Location: Left Arm)   Pulse 72   Temp 98.1 F (36.7 C) (Oral)   Resp 16   SpO2 98%   Visual Acuity Right Eye Distance:   Left Eye Distance:   Bilateral Distance:    Right Eye Near:   Left Eye Near:    Bilateral Near:     Physical Exam Vitals reviewed.  Constitutional:      General: She is awake. She is not in acute distress.    Appearance: Normal appearance. She is well-developed. She is not ill-appearing, toxic-appearing or diaphoretic.  HENT:     Head: Normocephalic.     Right Ear: Hearing normal.     Left Ear: Hearing normal.     Nose: Nose normal.     Mouth/Throat:     Mouth: Mucous membranes are moist.  Eyes:     General: Vision grossly intact.     Conjunctiva/sclera: Conjunctivae normal.  Cardiovascular:     Rate and Rhythm: Normal rate and regular rhythm.     Heart sounds: Normal heart sounds.  Pulmonary:     Effort: Pulmonary effort is normal.     Breath sounds: Normal breath sounds and air entry.  Musculoskeletal:        General: Normal range of motion.     Cervical back: Full passive range of motion without pain, normal range of motion and neck supple.     Left knee: Swelling present. No deformity, effusion,  erythema or crepitus. Normal range of motion. Tenderness present over the patellar tendon. Normal alignment, normal meniscus and normal patellar mobility.  Skin:    General:  Skin is warm and dry.  Neurological:     General: No focal deficit present.     Mental Status: She is alert and oriented to person, place, and time.  Psychiatric:        Speech: Speech normal.        Behavior: Behavior is cooperative.      UC Treatments / Results  Labs (all labs ordered are listed, but only abnormal results are displayed) Labs Reviewed - No data to display  EKG   Radiology DG Knee Complete 4 Views Left Result Date: 08/28/2023 CLINICAL DATA:  Pain post fall EXAM: LEFT KNEE - COMPLETE 4+ VIEW COMPARISON:  10/13/2009 FINDINGS: No definitive fracture or malalignment. Small knee effusion. Relatively patent joint spaces IMPRESSION: No acute osseous abnormality. Small knee effusion. Electronically Signed   By: Luke Bun M.D.   On: 08/28/2023 17:57    Procedures Procedures (including critical care time)  Medications Ordered in UC Medications - No data to display  Initial Impression / Assessment and Plan / UC Course  I have reviewed the triage vital signs and the nursing notes.  Pertinent labs & imaging results that were available during my care of the patient were reviewed by me and considered in my medical decision making (see chart for details).     Patient presents with knee pain and swelling after tripping and striking her knee on the side of a bench earlier today. She reports being able to walk but with discomfort. Examination reveals swelling over the patellar region. X-ray shows a small effusion with no evidence of fracture or dislocation. Given her history of liver issues, acetaminophen  was avoided due to potential hepatotoxicity. Ibuprofen  was deemed appropriate as the patient has no known kidney disease, and Motrin  was prescribed with instructions to avoid additional NSAIDs. RICE  therapy was advised, and a knee sleeve was provided for support. Ortho follow-up is recommended in 10-14 days if symptoms do not improve. ED precautions were reviewed.  Today's evaluation has revealed no signs of a dangerous process. Discussed diagnosis with patient and/or guardian. Patient and/or guardian aware of their diagnosis, possible red flag symptoms to watch out for and need for close follow up. Patient and/or guardian understands verbal and written discharge instructions. Patient and/or guardian comfortable with plan and disposition.  Patient and/or guardian has a clear mental status at this time, good insight into illness (after discussion and teaching) and has clear judgment to make decisions regarding their care  Documentation was completed with the aid of voice recognition software. Transcription may contain typographical errors. Final Clinical Impressions(s) / UC Diagnoses   Final diagnoses:  Acute pain of left knee     Discharge Instructions      You were seen today for knee pain and swelling after falling and hitting your knee. X-rays showed no broken bones or dislocation, but there is a small amount of fluid in the joint. This is likely due to soft tissue injury from the impact.  You were prescribed Motrin  to help with pain and inflammation. Do not take any other NSAIDs (such as Aleve or Advil ) while using this medication. Avoid Tylenol  due to your history of liver issues. Use RICE therapy to help with healing: rest the knee, apply ice for 15-20 minutes at a time several times a day, wear the knee sleeve that was provided for support, and elevate the leg when possible to reduce swelling.  Try to limit activities that cause discomfort, but gentle walking is okay as tolerated.  Follow up with an orthopedic specialist in 10 to 14 days if the pain or swelling does not improve.   Seek medical attention sooner if you develop increasing pain, inability to bear weight, redness or  warmth over the knee, fever, or other new symptoms.      ED Prescriptions     Medication Sig Dispense Auth. Provider   ibuprofen  (ADVIL ) 800 MG tablet Take 1 tablet (800 mg total) by mouth every 8 (eight) hours as needed (pain). 21 tablet Iola Lukes, FNP      PDMP not reviewed this encounter.   Iola Lukes, [place] 08/28/23 1806

## 2023-08-28 NOTE — Discharge Instructions (Addendum)
 You were seen today for knee pain and swelling after falling and hitting your knee. X-rays showed no broken bones or dislocation, but there is a small amount of fluid in the joint. This is likely due to soft tissue injury from the impact.  You were prescribed Motrin  to help with pain and inflammation. Do not take any other NSAIDs (such as Aleve or Advil ) while using this medication. Avoid Tylenol  due to your history of liver issues. Use RICE therapy to help with healing: rest the knee, apply ice for 15-20 minutes at a time several times a day, wear the knee sleeve that was provided for support, and elevate the leg when possible to reduce swelling.  Try to limit activities that cause discomfort, but gentle walking is okay as tolerated.   Follow up with an orthopedic specialist in 10 to 14 days if the pain or swelling does not improve.   Seek medical attention sooner if you develop increasing pain, inability to bear weight, redness or warmth over the knee, fever, or other new symptoms.

## 2023-09-09 DIAGNOSIS — M25562 Pain in left knee: Secondary | ICD-10-CM | POA: Diagnosis not present

## 2023-09-09 DIAGNOSIS — M7989 Other specified soft tissue disorders: Secondary | ICD-10-CM | POA: Diagnosis not present

## 2023-09-09 DIAGNOSIS — M79605 Pain in left leg: Secondary | ICD-10-CM | POA: Diagnosis not present

## 2023-09-10 ENCOUNTER — Ambulatory Visit (HOSPITAL_COMMUNITY)
Admission: RE | Admit: 2023-09-10 | Discharge: 2023-09-10 | Disposition: A | Discharge status: Home / Self Care | Source: Ambulatory Visit | Attending: Vascular Surgery | Admitting: Vascular Surgery

## 2023-09-10 ENCOUNTER — Other Ambulatory Visit (HOSPITAL_COMMUNITY): Payer: Self-pay | Admitting: Otolaryngology

## 2023-09-10 DIAGNOSIS — M79605 Pain in left leg: Secondary | ICD-10-CM | POA: Insufficient documentation

## 2023-09-10 DIAGNOSIS — M7989 Other specified soft tissue disorders: Secondary | ICD-10-CM

## 2023-10-04 DIAGNOSIS — K7401 Hepatic fibrosis, early fibrosis: Secondary | ICD-10-CM | POA: Diagnosis not present

## 2023-10-04 DIAGNOSIS — D84821 Immunodeficiency due to drugs: Secondary | ICD-10-CM | POA: Diagnosis not present

## 2023-10-04 DIAGNOSIS — K754 Autoimmune hepatitis: Secondary | ICD-10-CM | POA: Diagnosis not present

## 2023-10-04 DIAGNOSIS — Z79899 Other long term (current) drug therapy: Secondary | ICD-10-CM | POA: Diagnosis not present

## 2023-10-07 DIAGNOSIS — I10 Essential (primary) hypertension: Secondary | ICD-10-CM | POA: Diagnosis not present

## 2023-11-16 DIAGNOSIS — I10 Essential (primary) hypertension: Secondary | ICD-10-CM | POA: Diagnosis not present
